# Patient Record
Sex: Male | Born: 1975 | Race: White | Hispanic: No | Marital: Married | Smoking: Former smoker
Health system: Southern US, Community
[De-identification: ages and names within clinical notes are randomized; demographics above are authoritative.]

## PROBLEM LIST (undated history)

## (undated) DIAGNOSIS — L409 Psoriasis, unspecified: Secondary | ICD-10-CM

## (undated) DIAGNOSIS — G4730 Sleep apnea, unspecified: Secondary | ICD-10-CM

## (undated) DIAGNOSIS — Z9989 Dependence on other enabling machines and devices: Secondary | ICD-10-CM

## (undated) DIAGNOSIS — E291 Testicular hypofunction: Secondary | ICD-10-CM

## (undated) DIAGNOSIS — T7840XA Allergy, unspecified, initial encounter: Secondary | ICD-10-CM

## (undated) DIAGNOSIS — R51 Headache: Secondary | ICD-10-CM

## (undated) DIAGNOSIS — G4733 Obstructive sleep apnea (adult) (pediatric): Secondary | ICD-10-CM

## (undated) HISTORY — DX: Allergy, unspecified, initial encounter: T78.40XA

## (undated) HISTORY — DX: Psoriasis, unspecified: L40.9

## (undated) HISTORY — PX: HERNIA REPAIR: SHX51

## (undated) HISTORY — DX: Morbid (severe) obesity due to excess calories: E66.01

## (undated) HISTORY — DX: Obstructive sleep apnea (adult) (pediatric): G47.33

## (undated) HISTORY — DX: Dependence on other enabling machines and devices: Z99.89

## (undated) HISTORY — DX: Testicular hypofunction: E29.1

---

## 2006-04-16 ENCOUNTER — Ambulatory Visit: Payer: Self-pay | Admitting: Family Medicine

## 2006-07-31 ENCOUNTER — Ambulatory Visit: Payer: Self-pay | Admitting: Family Medicine

## 2006-08-06 ENCOUNTER — Ambulatory Visit: Payer: Self-pay | Admitting: Family Medicine

## 2006-11-26 ENCOUNTER — Ambulatory Visit: Payer: Self-pay | Admitting: Family Medicine

## 2006-11-27 ENCOUNTER — Ambulatory Visit: Payer: Self-pay | Admitting: Family Medicine

## 2006-12-01 ENCOUNTER — Emergency Department (HOSPITAL_COMMUNITY): Admission: EM | Admit: 2006-12-01 | Discharge: 2006-12-01 | Payer: Self-pay | Admitting: Emergency Medicine

## 2007-03-21 ENCOUNTER — Ambulatory Visit: Payer: Self-pay | Admitting: Family Medicine

## 2007-11-14 ENCOUNTER — Ambulatory Visit: Payer: Self-pay | Admitting: Family Medicine

## 2007-11-20 ENCOUNTER — Ambulatory Visit: Payer: Self-pay | Admitting: Family Medicine

## 2007-11-20 ENCOUNTER — Encounter: Admission: RE | Admit: 2007-11-20 | Discharge: 2007-11-20 | Payer: Self-pay | Admitting: Family Medicine

## 2008-01-19 ENCOUNTER — Ambulatory Visit: Payer: Self-pay | Admitting: Family Medicine

## 2008-02-20 ENCOUNTER — Encounter: Admission: RE | Admit: 2008-02-20 | Discharge: 2008-02-20 | Payer: Self-pay | Admitting: Endocrinology

## 2009-01-17 ENCOUNTER — Ambulatory Visit: Payer: Self-pay | Admitting: Family Medicine

## 2009-04-05 ENCOUNTER — Emergency Department (HOSPITAL_COMMUNITY): Admission: EM | Admit: 2009-04-05 | Discharge: 2009-04-05 | Payer: Self-pay | Admitting: Emergency Medicine

## 2009-07-10 ENCOUNTER — Emergency Department (HOSPITAL_COMMUNITY): Admission: EM | Admit: 2009-07-10 | Discharge: 2009-07-10 | Payer: Self-pay | Admitting: Family Medicine

## 2010-04-03 ENCOUNTER — Encounter: Payer: Self-pay | Admitting: Family Medicine

## 2010-05-30 ENCOUNTER — Encounter: Payer: Self-pay | Admitting: Family Medicine

## 2010-06-20 ENCOUNTER — Encounter: Payer: Self-pay | Admitting: Family Medicine

## 2010-06-22 ENCOUNTER — Encounter: Payer: Self-pay | Admitting: Family Medicine

## 2010-06-22 ENCOUNTER — Encounter (INDEPENDENT_AMBULATORY_CARE_PROVIDER_SITE_OTHER): Payer: BLUE CROSS/BLUE SHIELD | Admitting: Family Medicine

## 2010-06-22 DIAGNOSIS — E669 Obesity, unspecified: Secondary | ICD-10-CM

## 2010-06-22 DIAGNOSIS — Z0000 Encounter for general adult medical examination without abnormal findings: Secondary | ICD-10-CM

## 2010-06-22 DIAGNOSIS — E291 Testicular hypofunction: Secondary | ICD-10-CM

## 2010-06-22 DIAGNOSIS — L408 Other psoriasis: Secondary | ICD-10-CM

## 2010-06-23 ENCOUNTER — Encounter: Payer: Self-pay | Admitting: Family Medicine

## 2010-06-26 ENCOUNTER — Ambulatory Visit (INDEPENDENT_AMBULATORY_CARE_PROVIDER_SITE_OTHER): Payer: BLUE CROSS/BLUE SHIELD | Admitting: Family Medicine

## 2010-06-26 DIAGNOSIS — E291 Testicular hypofunction: Secondary | ICD-10-CM

## 2010-09-14 ENCOUNTER — Encounter: Payer: Self-pay | Admitting: Family Medicine

## 2010-09-15 ENCOUNTER — Ambulatory Visit (INDEPENDENT_AMBULATORY_CARE_PROVIDER_SITE_OTHER): Payer: BLUE CROSS/BLUE SHIELD | Admitting: Family Medicine

## 2010-09-15 ENCOUNTER — Encounter: Payer: Self-pay | Admitting: Family Medicine

## 2010-09-15 DIAGNOSIS — L409 Psoriasis, unspecified: Secondary | ICD-10-CM | POA: Insufficient documentation

## 2010-09-15 DIAGNOSIS — E291 Testicular hypofunction: Secondary | ICD-10-CM | POA: Insufficient documentation

## 2010-09-15 NOTE — Patient Instructions (Addendum)
Check with a psychologist before taking further steps

## 2010-09-15 NOTE — Progress Notes (Signed)
  Subjective:    Patient ID: Jeff Boyle, male    DOB: January 23, 1976, 35 y.o.   MRN: [id number]  HPI He is here for consult concerning weight loss. He is now considering lap band surgery. He states that in the past he has been on various weight loss programs including Weight Watchers and most recently is involved in a program soon medically supervised. He states that he usually can last 6-7 weeks before he gets tired of it and stops this. He has had difficulty with this his entire life. He did go to a seminar on lab band surgery and would like to pursue this further.   Review of Systems     Objective:   Physical Exam Alert and in no distress. Weight is recorded.       Assessment & Plan:  Morbid obesity. I his weight in regard to diet, exercise, psychological factors. I also discussed the TV program biggest loser with him. We also discussed surgical intervention. Encouraged him to get further psychological counseling before making a final decision. Over 30 minutes spent discussing all these issues with him. I will fill out the appropriate paperwork.

## 2010-09-19 ENCOUNTER — Telehealth: Payer: Self-pay

## 2010-09-19 NOTE — Telephone Encounter (Signed)
Called pt to get info on weight loss left message for him to please let me know who he used and how much weight loss

## 2010-11-17 ENCOUNTER — Encounter: Payer: Self-pay | Admitting: Family Medicine

## 2011-01-17 ENCOUNTER — Telehealth: Payer: Self-pay | Admitting: Family Medicine

## 2011-01-18 ENCOUNTER — Telehealth: Payer: Self-pay | Admitting: Family Medicine

## 2011-01-18 NOTE — Telephone Encounter (Signed)
LETTER MAILED TO PT-LM

## 2011-01-18 NOTE — Telephone Encounter (Signed)
DONE

## 2011-01-25 ENCOUNTER — Telehealth: Payer: Self-pay | Admitting: Family Medicine

## 2011-01-26 ENCOUNTER — Encounter: Payer: Self-pay | Admitting: Family Medicine

## 2011-01-26 ENCOUNTER — Ambulatory Visit (INDEPENDENT_AMBULATORY_CARE_PROVIDER_SITE_OTHER): Payer: BLUE CROSS/BLUE SHIELD | Admitting: Family Medicine

## 2011-01-26 DIAGNOSIS — E291 Testicular hypofunction: Secondary | ICD-10-CM

## 2011-01-26 NOTE — Telephone Encounter (Signed)
APPT MADE

## 2011-01-26 NOTE — Progress Notes (Signed)
  Subjective:    Patient ID: Jeff Boyle, male    DOB: 1975/09/13, 35 y.o.   MRN: [id number]  HPI He is here for consult concerning weight loss. He is in the process to try and get set up for surgery. He is not sure which kind. He hasn't seen a psychiatrist. My record was reviewed with him. He has maintained a BMI in the 50 range since becoming my patient. He apparently has tried various weight loss regimens and all have failed and he admits is usually due to him not sustaining it for very long. He also is a history of hypogonadism and is being followed by Dr. Patsi Sears for this to   Review of Systems     Objective:   Physical Exam Alert and in no distress otherwise not examined       Assessment & Plan:   1. Morbid obesity   2. Hypogonadism male   paperwork was filled out. He will return here as needed

## 2011-03-16 ENCOUNTER — Ambulatory Visit (INDEPENDENT_AMBULATORY_CARE_PROVIDER_SITE_OTHER): Payer: BLUE CROSS/BLUE SHIELD | Admitting: General Surgery

## 2011-03-16 ENCOUNTER — Encounter (INDEPENDENT_AMBULATORY_CARE_PROVIDER_SITE_OTHER): Payer: Self-pay | Admitting: General Surgery

## 2011-03-16 NOTE — Patient Instructions (Signed)
We will start your weight loss surgery evaluation

## 2011-03-19 ENCOUNTER — Ambulatory Visit (INDEPENDENT_AMBULATORY_CARE_PROVIDER_SITE_OTHER): Payer: BLUE CROSS/BLUE SHIELD | Admitting: Medical

## 2011-03-19 ENCOUNTER — Encounter: Payer: Self-pay | Admitting: Medical

## 2011-03-19 ENCOUNTER — Encounter (INDEPENDENT_AMBULATORY_CARE_PROVIDER_SITE_OTHER): Payer: Self-pay | Admitting: General Surgery

## 2011-03-19 VITALS — BP 138/78 | HR 88 | Temp 98.2°F | Resp 18

## 2011-03-19 DIAGNOSIS — J329 Chronic sinusitis, unspecified: Secondary | ICD-10-CM | POA: Insufficient documentation

## 2011-03-19 MED ORDER — AMOXICILLIN 500 MG PO TABS: ORAL_TABLET | ORAL | Status: DC

## 2011-03-19 NOTE — Progress Notes (Signed)
Jeff Boyle is a 36 y.o. male who presents for 3 day hx/o sinus pressure, bad headaches, purulent nasal discharge, swollen lymph nodes, using Mucinex for symptoms.  Denies fever, chills, cough, no chest pain, SOB, ear pain.   Denies sick contacts.  No other aggravating or relieving factors.  No other c/o.  Past Medical History  Diagnosis Date  . Hypogonadism male   . Psoriasis   . OSA on CPAP   . Morbid obesity    ROS: Gen: no fever, chills Skin: no rash Cardio: no CP, palpitations Lungs: no SOB GI: no n/v/d   Objective:   Filed Vitals:   03/19/11 1347  BP: 138/78  Pulse: 88  Temp: 98.2 F (36.8 C)  Resp: 18    General appearance: Alert, WD/WN, no distress                             Skin: warm, no rash                           Head: + moderate frontal sinus tenderness,                            Eyes: conjunctiva normal, corneas clear, PERRLA                            Ears: pearly TMs, external ear canals normal                          Nose: septum midline, turbinates swollen, with erythema and clear discharge             Mouth/throat: MMM, tongue normal, mild pharyngeal erythema,  Post nasal drainage                           Neck: supple, no adenopathy, no thyromegaly, nontender                          Heart: RRR, normal S1, S2, no murmurs                         Lungs: CTA bilaterally, no wheezes, rales, or rhonchi      Assessment and Plan:   Encounter Diagnosis  Name Primary?  . Sinusitis Yes   Advised that symptoms and exam suggest URI, thus, begin nasal saline, c/t Mucinex, hydrate well, but if not improving in 2-3 days, can begin prescription given for Amoxicillin.  Tylenol or Ibuprofen OTC for fever and malaise.  Discussed symptomatic relief, nasal saline, and call or return if worse or not improving in 2-3 days.

## 2011-03-19 NOTE — Progress Notes (Signed)
Patient ID: Jeff Boyle, male   DOB: 01-23-1976, 36 y.o.   MRN: [id number]  Chief Complaint  Patient presents with  . Other    new bariatric- lap band initial    HPI Jeff Boyle is a 36 y.o. male.   HPI 36 year old morbidly obese Caucasian male with a BMI of 59.28 referred by his primary care physician for evaluation for weight loss surgery. The patient states that he struggled all his life with his weight. Despite several attempts for sustained weight loss, he has been unsuccessful. He has tried Weight Watchers, low calorie diets, and a dietitian plan all without long-term success.  He is unsure as to which procedure he is interested in. He would like to discuss more regarding gastric band as well as gastric bypass surgery.  His comorbidities include obstructive sleep apnea on CPAP, hypogonadism, and psoriasis. Past Medical History  Diagnosis Date  . Hypogonadism male   . Psoriasis   . OSA on CPAP   . Morbid obesity     Past Surgical History  Procedure Date  . Hernia repair     as a child- RIH    Family History  Problem Relation Age of Onset  . Diabetes Mother   . Cancer Maternal Grandmother     LUNG  . Heart disease Maternal Grandfather     Social History History  Substance Use Topics  . Smoking status: Former Games developer  . Smokeless tobacco: Never Used  . Alcohol Use: Yes    No Known Allergies  Current Outpatient Prescriptions  Medication Sig Dispense Refill  . Fexofenadine HCl (ALLEGRA PO) Take by mouth as needed.        Marland Kitchen amoxicillin (AMOXIL) 500 MG tablet 2 tablets po BID  40 tablet  0    Review of Systems Review of Systems  Constitutional: Negative for fever, appetite change and unexpected weight change.  HENT: Negative for hearing loss, neck pain and neck stiffness.   Eyes: Negative for photophobia and visual disturbance.  Respiratory: Negative for chest tightness and shortness of breath.        +OSA on CPAP  Cardiovascular: Negative for  chest pain and palpitations.       +DOE, denies orthopnea, SOB at rest, PND  Gastrointestinal: Negative for nausea, diarrhea, constipation and rectal pain.       Very infrequent reflux  Genitourinary: Negative for dysuria, hematuria and difficulty urinating.  Musculoskeletal: Negative for back pain and joint swelling.       B/l knee and ankle pain "discomfort"  Skin:       Has psoriasis  Neurological: Negative for seizures, syncope, speech difficulty, light-headedness and headaches.  Hematological: Negative.   Psychiatric/Behavioral: Negative.     Blood pressure 132/88, pulse 84, temperature 97.7 F (36.5 C), temperature source Temporal, resp. rate 18, height 6\' 1"  (1.854 m), weight 447 lb 6.4 oz (202.939 kg).  Physical Exam Physical Exam  Vitals reviewed. Constitutional: He is oriented to person, place, and time. He appears well-developed and well-nourished. No distress.       Morbidly obese  HENT:  Head: Normocephalic and atraumatic.  Nose: Nose normal.  Mouth/Throat: No oropharyngeal exudate.  Eyes: Conjunctivae and EOM are normal. No scleral icterus.  Neck: Neck supple. No JVD present. No tracheal deviation present.       Short, thick neck  Cardiovascular: Normal rate, regular rhythm and normal heart sounds.   Pulmonary/Chest: Effort normal and breath sounds normal. No respiratory distress. He has no wheezes.  Abdominal: Soft. Bowel sounds are normal. He exhibits no distension. There is no tenderness.       Truncal obesity  Musculoskeletal: Normal range of motion. He exhibits no tenderness.  Lymphadenopathy:    He has no cervical adenopathy.  Neurological: He is alert and oriented to person, place, and time. He exhibits normal muscle tone.  Skin: Skin is warm and dry. He is not diaphoretic.       Some psoriasis on lower legs; thickened skin on b/l LE  Psychiatric: He has a normal mood and affect. His behavior is normal. Judgment and thought content normal.    Data  Reviewed Self reported diet history form Dr Jeff Boyle note (psychiatrist)  Assessment    Morbid Obesity BMI 59 OSA on CPAP Joint Pain Hypogonadism    Plan    Since he is unsure as to which procedure (band vs bypass) we discussed both surgeries that I perform.   We discussed laparoscopic adjustable gastric banding. The patient was given Agricultural engineer. We discussed the risk and benefits of surgery including but not limited to bleeding, infection, injury to surrounding structures, blood clot formation such as deep venous thrombosis or pulmonary embolism, need to convert to an open procedure, band slippage, band erosion, failure to loose weight, port complications (leak or flippage), potential need for reoperative surgery, esophageal dilatation, worsening reflux, and vitamin deficiencies. We discussed the typical post operative recovery course. We discussed that their postoperative diet will be modified for several weeks. We specifically talked about the need to be on a liquid diet for one to 2 weeks after surgery. We also discussed the typical postoperative course with a laparoscopic adjustable gastric band and the need for frequent postoperative visits to assess the volume status of the band.  We discussed the typical expected weight loss with a laparoscopic adjustable gastric band. I explained to the patient that they can expect to lose 40-60% of their excess body weight if they are compliant with their postoperative instructions. However I did explain that some patients loose less than 40% and some patients lose more than 60% of their excess body weight.  I explained that the likelihood of improvement in their obesity is good.  We discussed laparoscopic Roux-en-Y gastric bypass. The patient was given educational material. I quoted the patient that they can expect to lose 50-70% of their excess weight with the gastric bypass. We did discuss the possibility of weight regain several years after  the procedure.  We discussed the risk and benefits of surgery including but not limited to anesthesia risk, bleeding, infection, blood clot formation, anastomotic leak, anastomotic stricture, ulcer formation, death, respiratory complications, intestinal blockage, internal hernia, gallstone formation, vitamin and nutritional deficiencies, injury to surrounding structures, failure to lose weight and mood changes.  We discussed that before and after surgery that there would be an alteration in their diet. I explained that we have put them on a diet 2 weeks before surgery. I also explained that they would be on a liquid diet for 2 weeks after surgery. We discussed that they would have to avoid certain foods such as sugar after surgery. We discussed the importance of physical activity as well as compliance with our dietary and supplement recommendations.  I spent approximately 30 minutes counseling and discussing weight loss surgery with the patient and his wife.   He has decided to proceed with LAPAROSCOPIC ROUX-EN-Y GASTRIC BYPASS.  We will start our weight loss surgery evaluation & work-up including labs, upper gi, abdominal u/s,  ekg, nutrition consult.  We will then submit his information to his insurance provider.  [name]. Andrey Campanile, MD, FACS General, Bariatric, & Minimally Invasive Surgery [facility], [place]         [facility] M 03/19/2011, 4:07 PM

## 2011-03-19 NOTE — Patient Instructions (Signed)

## 2011-03-26 ENCOUNTER — Ambulatory Visit (HOSPITAL_COMMUNITY)
Admission: RE | Admit: 2011-03-26 | Discharge: 2011-03-26 | Disposition: A | Discharge status: Home / Self Care | Payer: BC Managed Care – PPO | Source: Ambulatory Visit | Attending: General Surgery | Admitting: General Surgery

## 2011-03-26 ENCOUNTER — Other Ambulatory Visit (INDEPENDENT_AMBULATORY_CARE_PROVIDER_SITE_OTHER): Payer: Self-pay | Admitting: General Surgery

## 2011-03-26 ENCOUNTER — Other Ambulatory Visit: Payer: Self-pay

## 2011-03-26 DIAGNOSIS — Z419 Encounter for procedure for purposes other than remedying health state, unspecified: Secondary | ICD-10-CM

## 2011-03-26 DIAGNOSIS — K7689 Other specified diseases of liver: Secondary | ICD-10-CM | POA: Insufficient documentation

## 2011-03-26 DIAGNOSIS — Z01818 Encounter for other preprocedural examination: Secondary | ICD-10-CM | POA: Insufficient documentation

## 2011-03-26 DIAGNOSIS — Z01810 Encounter for preprocedural cardiovascular examination: Secondary | ICD-10-CM | POA: Insufficient documentation

## 2011-03-28 ENCOUNTER — Encounter: Payer: BC Managed Care – PPO | Attending: General Surgery | Admitting: *Deleted

## 2011-03-28 ENCOUNTER — Encounter: Payer: Self-pay | Admitting: *Deleted

## 2011-03-28 DIAGNOSIS — Z01818 Encounter for other preprocedural examination: Secondary | ICD-10-CM | POA: Insufficient documentation

## 2011-03-28 DIAGNOSIS — Z713 Dietary counseling and surveillance: Secondary | ICD-10-CM | POA: Insufficient documentation

## 2011-03-28 NOTE — Progress Notes (Signed)
  Pre-Op Assessment Visit: Pre-Operative Gastric Bypass Surgery  Medical Nutrition Therapy:  Appt start time: 1000 end time:  1100.  Patient was seen on 03/28/2011 for Pre-Operative Gastric Bypass Nutrition Assessment. Assessment and letter of approval faxed to [facility] Bariatric Surgery Program coordinator on 03/28/2011.  Approval letter sent to [facility] center and will be available in the chart under the media tab.  Handouts given during visit include:  Pre-Op Goals Handout  Patient to call for Pre-Op and Post-Op Nutrition Education at [facility] when surgery is scheduled.

## 2011-03-28 NOTE — Patient Instructions (Signed)
   Follow Pre-Op Nutrition Goals to prepare for Gastric Bypass Surgery.   Call the [facility] at [phone] once you have been given your surgery date to enrolled in the Pre-Op Nutrition Class. You will need to attend this nutrition class 3-4 weeks prior to your surgery. 

## 2011-03-29 ENCOUNTER — Encounter (HOSPITAL_COMMUNITY): Payer: Self-pay | Admitting: Pharmacy Technician

## 2011-04-06 ENCOUNTER — Encounter (HOSPITAL_COMMUNITY)
Admission: RE | Disposition: A | Discharge status: Home / Self Care | Payer: Self-pay | Source: Ambulatory Visit | Attending: General Surgery

## 2011-04-06 ENCOUNTER — Ambulatory Visit (HOSPITAL_COMMUNITY)
Admission: RE | Admit: 2011-04-06 | Discharge: 2011-04-06 | Disposition: A | Discharge status: Home / Self Care | Payer: BC Managed Care – PPO | Source: Ambulatory Visit | Attending: General Surgery | Admitting: General Surgery

## 2011-04-06 DIAGNOSIS — Z01818 Encounter for other preprocedural examination: Secondary | ICD-10-CM | POA: Insufficient documentation

## 2011-04-06 HISTORY — PX: BREATH TEK H PYLORI: SHX5422

## 2011-04-06 SURGERY — BREATH TEST, FOR HELICOBACTER PYLORI

## 2011-04-10 ENCOUNTER — Encounter (HOSPITAL_COMMUNITY): Payer: Self-pay | Admitting: General Surgery

## 2011-04-21 LAB — CBC WITH DIFFERENTIAL/PLATELET
Basophils Absolute: 0 10*3/uL (ref 0.0–0.1)
Basophils Relative: 0 % (ref 0–1)
Eosinophils Absolute: 0.2 10*3/uL (ref 0.0–0.7)
Eosinophils Relative: 2 % (ref 0–5)
Lymphocytes Relative: 27 % (ref 12–46)
MCV: 82.9 fL (ref 78.0–100.0)
Platelets: 285 10*3/uL (ref 150–400)
RDW: 14.3 % (ref 11.5–15.5)
WBC: 7.8 10*3/uL (ref 4.0–10.5)

## 2011-04-21 LAB — LIPID PANEL
HDL: 36 mg/dL — ABNORMAL LOW (ref >39)
LDL Cholesterol: 75 mg/dL (ref 0–99)
Total CHOL/HDL Ratio: 4.1 Ratio
VLDL: 36 mg/dL (ref 0–40)

## 2011-04-21 LAB — COMPREHENSIVE METABOLIC PANEL
ALT: 24 U/L (ref 0–53)
AST: 21 U/L (ref 0–37)
Alkaline Phosphatase: 54 U/L (ref 39–117)
Chloride: 102 mEq/L (ref 96–112)
Creat: 0.8 mg/dL (ref 0.50–1.35)
Total Bilirubin: 0.4 mg/dL (ref 0.3–1.2)

## 2011-04-25 ENCOUNTER — Other Ambulatory Visit (INDEPENDENT_AMBULATORY_CARE_PROVIDER_SITE_OTHER): Payer: Self-pay | Admitting: General Surgery

## 2011-05-15 NOTE — Patient Instructions (Signed)
Follow:    Pre-Op Diet per MD 2 weeks prior to surgery  Phase 2- Liquids (clear/full) 2 weeks after surgery  Vitamin/Mineral/Calcium guidelines for purchasing bariatric supplements  Exercise guidelines pre and post-op per MD  Follow-up at [facility] in 2 weeks post-op for diet advancement. Contact Babara [facility] as needed with questions/concerns.  

## 2011-05-15 NOTE — Progress Notes (Signed)
  Bariatric Class:  Appt start time: 0830 end time:  0930.  Pre-Operative Nutrition Class  Patient was seen on 05/17/11 for Pre-Operative Bariatric Surgery Education at the [facility].  Surgery date: 3/18 Surgery type: RNY Gastric Bypass Last weight (03/28/11): 448.2 lbs  Samples given per MNT protocol: Bariatric Advantage Multivitamin Lot # [id number] Exp: 9/13  Bariatric Advantage Calcium Citrate Lot # [id number] Exp: 12/13  Bariatric Advantage B12 Lot # [id number] Exp: 5/13  Celebrate Vitamins Multivitamin Lot # [id number] Exp: 6/14  Celebrate VitaminsCalcium Citrate Lot # [id number] Exp: 10/14  Unjury Protein - Chicken Soup Lot# [id number] Exp: 5/14  The following the learning objective met by the patient during this course:   Identifies Pre-Op Dietary Goals and will begin 2 weeks pre-operatively   Identifies appropriate sources of fluids and proteins   States protein recommendations and appropriate sources pre and post-operatively  Identifies Post-Operative Dietary Goals and will follow for 2 weeks post-operatively  Identifies appropriate multivitamin and calcium sources  Describes the need for physical activity post-operatively and will follow MD recommendations  States when to call healthcare provider regarding medication questions or post-operative complications  Handouts given during class include:  Pre-Op Bariatric Surgery Diet Handout  Protein Shake Handout  Post-Op Bariatric Surgery Nutrition Handout  BELT Program Information Flyer  Support Group Information Flyer  Follow-Up Plan: Patient will follow-up at [facility] 2 weeks post operatively for diet advancement per MD.

## 2011-05-17 ENCOUNTER — Encounter (HOSPITAL_COMMUNITY): Payer: Self-pay | Admitting: Pharmacy Technician

## 2011-05-17 ENCOUNTER — Encounter: Payer: BC Managed Care – PPO | Attending: General Surgery

## 2011-05-17 DIAGNOSIS — Z01818 Encounter for other preprocedural examination: Secondary | ICD-10-CM | POA: Insufficient documentation

## 2011-05-17 DIAGNOSIS — Z713 Dietary counseling and surveillance: Secondary | ICD-10-CM | POA: Insufficient documentation

## 2011-05-22 ENCOUNTER — Encounter (HOSPITAL_COMMUNITY)
Admission: RE | Admit: 2011-05-22 | Discharge: 2011-05-22 | Disposition: A | Discharge status: Home / Self Care | Payer: BC Managed Care – PPO | Source: Ambulatory Visit | Attending: General Surgery | Admitting: General Surgery

## 2011-05-22 ENCOUNTER — Encounter (HOSPITAL_COMMUNITY): Payer: Self-pay

## 2011-05-22 HISTORY — DX: Headache: R51

## 2011-05-22 HISTORY — DX: Sleep apnea, unspecified: G47.30

## 2011-05-22 LAB — DIFFERENTIAL
Basophils Absolute: 0 10*3/uL (ref 0.0–0.1)
Eosinophils Absolute: 0.1 10*3/uL (ref 0.0–0.7)
Eosinophils Relative: 1 % (ref 0–5)

## 2011-05-22 LAB — COMPREHENSIVE METABOLIC PANEL
ALT: 24 U/L (ref 0–53)
Alkaline Phosphatase: 53 U/L (ref 39–117)
CO2: 28 mEq/L (ref 19–32)
GFR calc Af Amer: >90 mL/min (ref >90)
GFR calc non Af Amer: >90 mL/min (ref >90)
Glucose, Bld: 84 mg/dL (ref 70–99)
Potassium: 3.9 mEq/L (ref 3.5–5.1)
Sodium: 136 mEq/L (ref 135–145)
Total Bilirubin: 0.4 mg/dL (ref 0.3–1.2)

## 2011-05-22 LAB — SURGICAL PCR SCREEN: MRSA, PCR: NEGATIVE

## 2011-05-22 LAB — CBC
Hemoglobin: 13.8 g/dL (ref 13.0–17.0)
MCH: 27.7 pg (ref 26.0–34.0)
RBC: 4.98 MIL/uL (ref 4.22–5.81)

## 2011-05-22 NOTE — Patient Instructions (Addendum)
20 TAHJ NJOKU  05/22/2011   Your procedure is scheduled on:  05/28/11  Report to SHORT STAY DEPT  at 9:30 AM.  Call this number if you have problems the morning of surgery: [phone]   Remember:   Do not eat food or drink liquids AFTER MIDNIGHT  May have clear liquids UNTIL 6 HOURS BEFORE SURGERY (6:00 AM)  Clear liquids include soda, tea, black coffee, apple or grape juice, broth.  Take these medicines the morning of surgery with A SIP OF WATER:    Do not wear jewelry, make-up or nail polish.  Do not wear lotions, powders, or perfumes.   Do not shave legs or underarms 48 hrs. before surgery (men may shave face)  Do not bring valuables to the hospital.  Contacts, dentures or bridgework may not be worn into surgery.  Leave suitcase in the car. After surgery it may be brought to your room.  For patients admitted to the hospital, checkout time is 11:00 AM the day of discharge.   Patients discharged the day of surgery will not be allowed to drive home.  Name and phone number of your driver:   Special Instructions:   Please read over the following fact sheets that you were given: MRSA  Information / Incentive Spirometer instructions               SHOWER WITH BETASEPT THE NIGHT BEFORE SURGERY AND THE MORNING OF SURGERY               FOLLOW BOWEL PREP FROM OFFICE              NO ASPIRIN / HERBS / IBUPROFEN-MOTRIN-ALEVE 7 DAYS PREOP              BRING C-PAP MASK TO HOSPITAL

## 2011-05-23 ENCOUNTER — Encounter (INDEPENDENT_AMBULATORY_CARE_PROVIDER_SITE_OTHER): Payer: Self-pay | Admitting: General Surgery

## 2011-05-23 ENCOUNTER — Ambulatory Visit (INDEPENDENT_AMBULATORY_CARE_PROVIDER_SITE_OTHER): Payer: BC Managed Care – PPO | Admitting: General Surgery

## 2011-05-23 MED ORDER — PEG 3350-KCL-NABCB-NACL-NASULF 236 G PO SOLR: 4.0000 L | Freq: Once | ORAL | Status: AC

## 2011-05-23 NOTE — Progress Notes (Signed)
Patient ID: Jeff Boyle, male   DOB: 01/30/1976, 35 y.o.   MRN: [id number]  Chief Complaint  Patient presents with  . Bariatric Pre-op    RNY    HPI Jeff Boyle is a 35 y.o. male.   HPI 35 yo morbidly obese WM who I initially saw on January 7 for consideration for gastric bypass surgery. His surgery has been approved and he is currently scheduled for surgery this Monday. He denies any new medications. He denies any trips to the ER or hospital. He denies any new medical diagnoses. He has been walking some. He states he has started his preop diet.   Past Medical History  Diagnosis Date  . Hypogonadism male   . Psoriasis   . OSA on CPAP   . Morbid obesity   . Headache   . Psoriasis   . Sleep apnea     C PAP  . Allergy     Past Surgical History  Procedure Date  . Hernia repair     as a child- RIH  . Breath tek h pylori 04/06/2011    Procedure: BREATH TEK H PYLORI;  Surgeon: Lenaya Pietsch M Nivedita Mirabella, MD;  Location: [facility];  Service: General;  Laterality: N/A;    Family History  Problem Relation Age of Onset  . Diabetes Mother   . Cancer Maternal Grandmother     LUNG  . Heart disease Maternal Grandfather     Social History History  Substance Use Topics  . Smoking status: Former Smoker    Quit date: 05/22/2006  . Smokeless tobacco: Never Used  . Alcohol Use: Yes     1-2 DRINKS PER WEEK    No Known Allergies  Current Outpatient Prescriptions  Medication Sig Dispense Refill  . polyethylene glycol (GOLYTELY) 236 G solution Take 4,000 mLs by mouth once.  4000 mL  0    Review of Systems Review of Systems  Constitutional: Negative for fever, chills, appetite change and unexpected weight change.  HENT: Negative for congestion, trouble swallowing and sinus pressure.   Eyes: Negative for visual disturbance.  Respiratory: Negative for chest tightness and shortness of breath.        +OSA on Cpap  Cardiovascular: Negative for chest pain and leg swelling.       No  PND, no orthopnea, a little DOE  Gastrointestinal:       See HPI  Genitourinary: Negative for dysuria and hematuria.  Musculoskeletal:       Some b/l knee pain  Skin: Negative for rash.  Neurological: Negative for seizures and speech difficulty.  Hematological: Does not bruise/bleed easily.  Psychiatric/Behavioral: Negative for behavioral problems and confusion.    Blood pressure 138/88, pulse 80, temperature 97 F (36.1 C), temperature source Temporal, resp. rate 18, height 6' 1" (1.854 m), weight 448 lb 3.2 oz (203.302 kg).  Physical Exam Physical Exam  Vitals reviewed. Constitutional: He is oriented to person, place, and time. He appears well-developed and well-nourished. No distress.  HENT:  Head: Normocephalic and atraumatic.  Right Ear: External ear normal.  Left Ear: External ear normal.  Mouth/Throat: No oropharyngeal exudate.  Eyes: Conjunctivae are normal. No scleral icterus.  Neck: Normal range of motion. Neck supple. No tracheal deviation present.  Cardiovascular: Normal rate, regular rhythm and normal heart sounds.   Pulmonary/Chest: Effort normal and breath sounds normal. No stridor. No respiratory distress. He has no wheezes.  Abdominal: Soft. Bowel sounds are normal. He exhibits no distension. There is no tenderness.         Large obese abdomen  Musculoskeletal: Normal range of motion. He exhibits no edema and no tenderness.  Lymphadenopathy:    He has no cervical adenopathy.  Neurological: He is alert and oriented to person, place, and time. He exhibits normal muscle tone.  Skin: Skin is warm and dry. He is not diaphoretic.  Psychiatric: He has a normal mood and affect. His behavior is normal. Judgment and thought content normal.    Data Reviewed My initial office note Nutrition consult Psychiatrist note abd u/s Upper gi - no hernia  Assessment    Morbid obesity BMI 59.13 OSA on CPAP hypogonadism     Plan    For Lap Roux-en-y gastric bypass on  Monday.    We reviewed his preop studies. We re-discussed the procedure along with the risks and benefits. We discussed the typical postoperative course. He will perform a bowel prep the day before surgery. He was reminded to bring his CPAP mask to the hospital.  Amiayah Giebel M. Mende Biswell, MD, FACS General, Bariatric, & Minimally Invasive Surgery [facility]        Copper Kirtley M 05/23/2011, 9:19 AM    

## 2011-05-24 ENCOUNTER — Telehealth (INDEPENDENT_AMBULATORY_CARE_PROVIDER_SITE_OTHER): Payer: Self-pay | Admitting: General Surgery

## 2011-05-24 NOTE — Telephone Encounter (Signed)
Pt was seen in the office and instructed to stop Allegra.  He has developed very bad clear runny nose and other allergy symptoms, including headache.  He is calling to ask if he can take plain Tylenol for the headache.  His surgery is Monday for gastric by-pass.  Per Dr. Andrey Campanile (paged) he may restart the Allegra and OK to take the Tylenol as well.  Pt is aware and will continue to avoid ibuprofen and Excedrin Migraine.

## 2011-05-28 ENCOUNTER — Encounter (HOSPITAL_COMMUNITY)
Admission: RE | Disposition: A | Discharge status: Home / Self Care | Payer: Self-pay | Source: Ambulatory Visit | Attending: General Surgery

## 2011-05-28 ENCOUNTER — Encounter (HOSPITAL_COMMUNITY): Payer: Self-pay | Admitting: *Deleted

## 2011-05-28 ENCOUNTER — Inpatient Hospital Stay (HOSPITAL_COMMUNITY)
Admission: RE | Admit: 2011-05-28 | Discharge: 2011-05-31 | DRG: 288 | Disposition: A | Discharge status: Home / Self Care | Payer: BC Managed Care – PPO | Source: Ambulatory Visit | Attending: General Surgery | Admitting: General Surgery

## 2011-05-28 ENCOUNTER — Encounter (HOSPITAL_COMMUNITY): Payer: Self-pay | Admitting: Anesthesiology

## 2011-05-28 ENCOUNTER — Ambulatory Visit (HOSPITAL_COMMUNITY): Payer: BC Managed Care – PPO | Admitting: Anesthesiology

## 2011-05-28 DIAGNOSIS — G4733 Obstructive sleep apnea (adult) (pediatric): Secondary | ICD-10-CM | POA: Diagnosis present

## 2011-05-28 DIAGNOSIS — Z9989 Dependence on other enabling machines and devices: Secondary | ICD-10-CM | POA: Diagnosis present

## 2011-05-28 DIAGNOSIS — E8881 Metabolic syndrome: Secondary | ICD-10-CM | POA: Diagnosis present

## 2011-05-28 DIAGNOSIS — L408 Other psoriasis: Secondary | ICD-10-CM | POA: Diagnosis present

## 2011-05-28 DIAGNOSIS — Z01812 Encounter for preprocedural laboratory examination: Secondary | ICD-10-CM

## 2011-05-28 DIAGNOSIS — Z6841 Body Mass Index (BMI) 40.0 and over, adult: Secondary | ICD-10-CM

## 2011-05-28 DIAGNOSIS — Z87891 Personal history of nicotine dependence: Secondary | ICD-10-CM

## 2011-05-28 HISTORY — PX: GASTRIC ROUX-EN-Y: SHX5262

## 2011-05-28 SURGERY — LAPAROSCOPIC ROUX-EN-Y GASTRIC
Anesthesia: General | Site: Abdomen | Wound class: Clean Contaminated

## 2011-05-28 MED ORDER — HEPARIN SODIUM (PORCINE) 5000 UNIT/ML IJ SOLN
INTRAMUSCULAR | Status: AC
  Filled 2011-05-28: qty 1

## 2011-05-28 MED ORDER — HYDROMORPHONE HCL PF 1 MG/ML IJ SOLN
0.2500 mg | INTRAMUSCULAR | Status: DC | PRN
  Administered 2011-05-28: 0.25 mg via INTRAVENOUS

## 2011-05-28 MED ORDER — DEXAMETHASONE SODIUM PHOSPHATE 10 MG/ML IJ SOLN
INTRAMUSCULAR | Status: DC | PRN
  Administered 2011-05-28: 5 mg via INTRAVENOUS

## 2011-05-28 MED ORDER — MORPHINE SULFATE 2 MG/ML IJ SOLN
2.0000 mg | INTRAMUSCULAR | Status: DC | PRN
  Administered 2011-05-28: 4 mg via INTRAVENOUS
  Administered 2011-05-28: 6 mg via INTRAVENOUS
  Administered 2011-05-29 (×6): 4 mg via INTRAVENOUS
  Administered 2011-05-29: 2 mg via INTRAVENOUS
  Administered 2011-05-29 – 2011-05-30 (×7): 4 mg via INTRAVENOUS
  Filled 2011-05-28 (×2): qty 2
  Filled 2011-05-28: qty 3
  Filled 2011-05-28: qty 2
  Filled 2011-05-28: qty 3
  Filled 2011-05-28 (×2): qty 2
  Filled 2011-05-28: qty 3
  Filled 2011-05-28: qty 2
  Filled 2011-05-28: qty 1
  Filled 2011-05-28 (×5): qty 2

## 2011-05-28 MED ORDER — OXYCODONE-ACETAMINOPHEN 5-325 MG/5ML PO SOLN
5.0000 mL | ORAL | Status: DC | PRN
  Administered 2011-05-30 – 2011-05-31 (×7): 10 mL via ORAL
  Filled 2011-05-28 (×8): qty 10

## 2011-05-28 MED ORDER — LIDOCAINE HCL (CARDIAC) 20 MG/ML IV SOLN
INTRAVENOUS | Status: DC | PRN
  Administered 2011-05-28: 50 mg via INTRAVENOUS

## 2011-05-28 MED ORDER — UNJURY VANILLA POWDER: 2.0000 [oz_av] | Freq: Four times a day (QID) | ORAL | Status: DC

## 2011-05-28 MED ORDER — KCL IN DEXTROSE-NACL 20-5-0.45 MEQ/L-%-% IV SOLN
INTRAVENOUS | Status: AC
  Filled 2011-05-28: qty 1000

## 2011-05-28 MED ORDER — HEPARIN SODIUM (PORCINE) 5000 UNIT/ML IJ SOLN
5000.0000 [IU] | INTRAMUSCULAR | Status: AC
  Administered 2011-05-28: 5000 [IU] via SUBCUTANEOUS

## 2011-05-28 MED ORDER — CISATRACURIUM BESYLATE 2 MG/ML IV SOLN
INTRAVENOUS | Status: DC | PRN
  Administered 2011-05-28 (×2): 4 mg via INTRAVENOUS
  Administered 2011-05-28: 6 mg via INTRAVENOUS

## 2011-05-28 MED ORDER — FIBRIN SEALANT COMPONENT 5 ML EX KIT
PACK | CUTANEOUS | Status: AC
  Filled 2011-05-28: qty 2

## 2011-05-28 MED ORDER — GLYCOPYRROLATE 0.2 MG/ML IJ SOLN
INTRAMUSCULAR | Status: DC | PRN
  Administered 2011-05-28: .8 mg via INTRAVENOUS

## 2011-05-28 MED ORDER — BUPIVACAINE-EPINEPHRINE PF 0.5-1:200000 % IJ SOLN
INTRAMUSCULAR | Status: DC | PRN
  Administered 2011-05-28: 30 mL

## 2011-05-28 MED ORDER — PROMETHAZINE HCL 25 MG/ML IJ SOLN: 6.2500 mg | INTRAMUSCULAR | Status: DC | PRN

## 2011-05-28 MED ORDER — ROCURONIUM BROMIDE 100 MG/10ML IV SOLN
INTRAVENOUS | Status: DC | PRN
  Administered 2011-05-28: 5 mg via INTRAVENOUS

## 2011-05-28 MED ORDER — MEPERIDINE HCL 50 MG/ML IJ SOLN: 6.2500 mg | INTRAMUSCULAR | Status: DC | PRN

## 2011-05-28 MED ORDER — PROPOFOL 10 MG/ML IV BOLUS
INTRAVENOUS | Status: DC | PRN
  Administered 2011-05-28: 250 mg via INTRAVENOUS

## 2011-05-28 MED ORDER — NEOSTIGMINE METHYLSULFATE 1 MG/ML IJ SOLN
INTRAMUSCULAR | Status: DC | PRN
  Administered 2011-05-28: 5 mg via INTRAVENOUS

## 2011-05-28 MED ORDER — MIDAZOLAM HCL 5 MG/5ML IJ SOLN
INTRAMUSCULAR | Status: DC | PRN
  Administered 2011-05-28: 2 mg via INTRAVENOUS

## 2011-05-28 MED ORDER — DEXTROSE 5 % IV SOLN
2.0000 g | INTRAVENOUS | Status: AC
  Administered 2011-05-28: 2 g via INTRAVENOUS
  Filled 2011-05-28: qty 2

## 2011-05-28 MED ORDER — HYDROMORPHONE HCL PF 1 MG/ML IJ SOLN
INTRAMUSCULAR | Status: AC
  Filled 2011-05-28: qty 1

## 2011-05-28 MED ORDER — UNJURY CHICKEN SOUP POWDER: 2.0000 [oz_av] | Freq: Four times a day (QID) | ORAL | Status: DC

## 2011-05-28 MED ORDER — ACETAMINOPHEN 10 MG/ML IV SOLN
INTRAVENOUS | Status: DC | PRN
  Administered 2011-05-28: 1000 mg via INTRAVENOUS

## 2011-05-28 MED ORDER — CEFOXITIN SODIUM-DEXTROSE 1-4 GM-% IV SOLR (PREMIX)
INTRAVENOUS | Status: AC
  Filled 2011-05-28: qty 100

## 2011-05-28 MED ORDER — TISSEEL VH 10 ML EX KIT
PACK | CUTANEOUS | Status: DC | PRN
  Administered 2011-05-28: 1

## 2011-05-28 MED ORDER — 0.9 % SODIUM CHLORIDE (POUR BTL) OPTIME
TOPICAL | Status: DC | PRN
  Administered 2011-05-28: 1000 mL

## 2011-05-28 MED ORDER — HEPARIN SODIUM (PORCINE) 5000 UNIT/ML IJ SOLN
5000.0000 [IU] | Freq: Three times a day (TID) | INTRAMUSCULAR | Status: DC
  Administered 2011-05-28 – 2011-05-31 (×8): 5000 [IU] via SUBCUTANEOUS
  Filled 2011-05-28 (×11): qty 1

## 2011-05-28 MED ORDER — SUFENTANIL CITRATE 50 MCG/ML IV SOLN
INTRAVENOUS | Status: DC | PRN
  Administered 2011-05-28 (×3): 10 ug via INTRAVENOUS
  Administered 2011-05-28: 5 ug via INTRAVENOUS
  Administered 2011-05-28 (×2): 10 ug via INTRAVENOUS
  Administered 2011-05-28: 5 ug via INTRAVENOUS
  Administered 2011-05-28: 10 ug via INTRAVENOUS

## 2011-05-28 MED ORDER — METOCLOPRAMIDE HCL 5 MG/ML IJ SOLN
INTRAMUSCULAR | Status: DC | PRN
  Administered 2011-05-28: 10 mg via INTRAVENOUS

## 2011-05-28 MED ORDER — ACETAMINOPHEN 10 MG/ML IV SOLN
INTRAVENOUS | Status: AC
  Filled 2011-05-28: qty 100

## 2011-05-28 MED ORDER — KCL IN DEXTROSE-NACL 20-5-0.45 MEQ/L-%-% IV SOLN
INTRAVENOUS | Status: DC
  Administered 2011-05-28: 100 mL/h via INTRAVENOUS
  Administered 2011-05-28: 1000 mL via INTRAVENOUS
  Administered 2011-05-29 – 2011-05-30 (×2): via INTRAVENOUS
  Filled 2011-05-28 (×7): qty 1000

## 2011-05-28 MED ORDER — LACTATED RINGERS IV SOLN: INTRAVENOUS | Status: DC

## 2011-05-28 MED ORDER — LACTATED RINGERS IR SOLN
Status: DC | PRN
  Administered 2011-05-28: 3000 mL

## 2011-05-28 MED ORDER — ACETAMINOPHEN 10 MG/ML IV SOLN
1000.0000 mg | Freq: Four times a day (QID) | INTRAVENOUS | Status: AC
  Administered 2011-05-28 – 2011-05-29 (×4): 1000 mg via INTRAVENOUS
  Filled 2011-05-28 (×4): qty 100

## 2011-05-28 MED ORDER — HYDROMORPHONE HCL PF 1 MG/ML IJ SOLN
INTRAMUSCULAR | Status: DC | PRN
  Administered 2011-05-28: 1 mg via INTRAVENOUS

## 2011-05-28 MED ORDER — MORPHINE SULFATE 4 MG/ML IJ SOLN
INTRAMUSCULAR | Status: AC
  Filled 2011-05-28: qty 1

## 2011-05-28 MED ORDER — ONDANSETRON HCL 4 MG/2ML IJ SOLN
INTRAMUSCULAR | Status: DC | PRN
  Administered 2011-05-28: 4 mg via INTRAVENOUS

## 2011-05-28 MED ORDER — SUCCINYLCHOLINE CHLORIDE 20 MG/ML IJ SOLN
INTRAMUSCULAR | Status: DC | PRN
  Administered 2011-05-28: 200 mg via INTRAVENOUS

## 2011-05-28 MED ORDER — ONDANSETRON HCL 4 MG/2ML IJ SOLN: 4.0000 mg | INTRAMUSCULAR | Status: DC | PRN

## 2011-05-28 MED ORDER — BUPIVACAINE LIPOSOME 1.3 % IJ SUSP
20.0000 mL | INTRAMUSCULAR | Status: AC
  Administered 2011-05-28: 20 mL
  Filled 2011-05-28: qty 20

## 2011-05-28 MED ORDER — ACETAMINOPHEN 160 MG/5ML PO SOLN
650.0000 mg | ORAL | Status: DC | PRN
  Administered 2011-05-30: 650 mg via ORAL
  Filled 2011-05-28: qty 20.3

## 2011-05-28 MED ORDER — UNJURY CHOCOLATE CLASSIC POWDER
2.0000 [oz_av] | Freq: Four times a day (QID) | ORAL | Status: DC
  Administered 2011-05-30 (×4): 2 [oz_av] via ORAL

## 2011-05-28 MED ORDER — HYDROMORPHONE HCL PF 1 MG/ML IJ SOLN
0.2500 mg | INTRAMUSCULAR | Status: DC | PRN
  Administered 2011-05-28 (×2): 0.25 mg via INTRAVENOUS
  Administered 2011-05-28 (×4): 0.5 mg via INTRAVENOUS

## 2011-05-28 MED ORDER — LACTATED RINGERS IV SOLN
INTRAVENOUS | Status: DC
  Administered 2011-05-28 (×2): via INTRAVENOUS
  Administered 2011-05-28: 1000 mL via INTRAVENOUS

## 2011-05-28 MED ORDER — BUPIVACAINE-EPINEPHRINE PF 0.25-1:200000 % IJ SOLN
INTRAMUSCULAR | Status: AC
  Filled 2011-05-28: qty 30

## 2011-05-28 SURGICAL SUPPLY — 83 items
APPLICATOR COTTON TIP 6IN STRL (MISCELLANEOUS) ×4 IMPLANT
APPLIER CLIP ROT 13.4 12 LRG (CLIP)
BENZOIN TINCTURE PRP APPL 2/3 (GAUZE/BANDAGES/DRESSINGS) ×2 IMPLANT
BLADE SURG 15 STRL LF DISP TIS (BLADE) IMPLANT
BLADE SURG 15 STRL SS (BLADE)
BLADE SURG SZ11 CARB STEEL (BLADE) ×2 IMPLANT
CABLE HIGH FREQUENCY MONO STRZ (ELECTRODE) ×2 IMPLANT
CANISTER SUCTION 2500CC (MISCELLANEOUS) ×4 IMPLANT
CLIP APPLIE ROT 13.4 12 LRG (CLIP) IMPLANT
CLIP SUT LAPRA TY ABSORB (SUTURE) ×4 IMPLANT
CLOTH BEACON ORANGE TIMEOUT ST (SAFETY) ×2 IMPLANT
CLSR STERI-STRIP ANTIMIC 1/2X4 (GAUZE/BANDAGES/DRESSINGS) ×2 IMPLANT
COVER SURGICAL LIGHT HANDLE (MISCELLANEOUS) ×2 IMPLANT
CUTTER LINEAR ENDO ART 45 ETS (STAPLE) ×2 IMPLANT
DERMABOND ADVANCED (GAUZE/BANDAGES/DRESSINGS)
DERMABOND ADVANCED .7 DNX12 (GAUZE/BANDAGES/DRESSINGS) IMPLANT
DEVICE SUTURE ENDOST 10MM (ENDOMECHANICALS) ×2 IMPLANT
DISSECTOR BLUNT TIP ENDO 5MM (MISCELLANEOUS) ×2 IMPLANT
DRAIN PENROSE 18X1/4 LTX STRL (WOUND CARE) ×2 IMPLANT
DRAPE CAMERA CLOSED 9X96 (DRAPES) ×2 IMPLANT
DRAPE UTILITY XL STRL (DRAPES) ×2 IMPLANT
DRSG TEGADERM 2-3/8X2-3/4 SM (GAUZE/BANDAGES/DRESSINGS) ×14 IMPLANT
ELECT REM PT RETURN 9FT ADLT (ELECTROSURGICAL) ×2
ELECTRODE REM PT RTRN 9FT ADLT (ELECTROSURGICAL) ×1 IMPLANT
GAUZE SPONGE 2X2 8PLY STRL LF (GAUZE/BANDAGES/DRESSINGS) ×3 IMPLANT
GAUZE SPONGE 4X4 16PLY XRAY LF (GAUZE/BANDAGES/DRESSINGS) ×2 IMPLANT
GLOVE BIO SURGEON STRL SZ7.5 (GLOVE) ×4 IMPLANT
GLOVE BIOGEL M STRL SZ7.5 (GLOVE) IMPLANT
GLOVE BIOGEL PI IND STRL 7.0 (GLOVE) ×2 IMPLANT
GLOVE BIOGEL PI INDICATOR 7.0 (GLOVE) ×2
GLOVE INDICATOR 8.0 STRL GRN (GLOVE) ×2 IMPLANT
GOWN STRL NON-REIN LRG LVL3 (GOWN DISPOSABLE) ×4 IMPLANT
GOWN STRL REIN XL XLG (GOWN DISPOSABLE) ×6 IMPLANT
HEMOSTAT SURGICEL 4X8 (HEMOSTASIS) IMPLANT
HOVERMATT SINGLE USE (MISCELLANEOUS) ×2 IMPLANT
KIT BASIN OR (CUSTOM PROCEDURE TRAY) ×2 IMPLANT
KIT GASTRIC LAVAGE 34FR ADT (SET/KITS/TRAYS/PACK) ×2 IMPLANT
NEEDLE SPNL 22GX3.5 QUINCKE BK (NEEDLE) ×2 IMPLANT
NS IRRIG 1000ML POUR BTL (IV SOLUTION) ×2 IMPLANT
PACK CARDIOVASCULAR III (CUSTOM PROCEDURE TRAY) ×2 IMPLANT
PEN SKIN MARKING BROAD (MISCELLANEOUS) ×2 IMPLANT
POUCH SPECIMEN RETRIEVAL 10MM (ENDOMECHANICALS) IMPLANT
RELOAD 45 VASCULAR/THIN (ENDOMECHANICALS) ×2 IMPLANT
RELOAD BLUE (STAPLE) ×8 IMPLANT
RELOAD ENDO STITCH 2.0 (ENDOMECHANICALS)
RELOAD GOLD (STAPLE) ×2 IMPLANT
RELOAD STAPLE TA45 3.5 REG BLU (ENDOMECHANICALS) ×4 IMPLANT
RELOAD WHITE ECR60W (STAPLE) ×2 IMPLANT
SCALPEL HARMONIC ACE (MISCELLANEOUS) ×2 IMPLANT
SCISSORS LAP 5X35 DISP (ENDOMECHANICALS) ×2 IMPLANT
SEALANT SURGICAL APPL DUAL CAN (MISCELLANEOUS) ×2 IMPLANT
SET IRRIG TUBING LAPAROSCOPIC (IRRIGATION / IRRIGATOR) ×2 IMPLANT
SLEEVE ADV FIXATION 12X100MM (TROCAR) ×6 IMPLANT
SLEEVE ADV FIXATION 5X100MM (TROCAR) ×2 IMPLANT
SLEEVE ENDOPATH XCEL 5M (ENDOMECHANICALS) ×2 IMPLANT
SOLUTION ANTI FOG 6CC (MISCELLANEOUS) ×2 IMPLANT
SPONGE GAUZE 2X2 STER 10/PKG (GAUZE/BANDAGES/DRESSINGS) ×3
SPONGE GAUZE 4X4 12PLY (GAUZE/BANDAGES/DRESSINGS) IMPLANT
STAPLE ECHEON FLEX 60 POW ENDO (STAPLE) ×2 IMPLANT
STAPLER VISISTAT 35W (STAPLE) ×2 IMPLANT
STRIP CLOSURE SKIN 1/2X4 (GAUZE/BANDAGES/DRESSINGS) ×2 IMPLANT
SUT ETHILON 3 0 PS 1 (SUTURE) IMPLANT
SUT MNCRL AB 4-0 PS2 18 (SUTURE) ×4 IMPLANT
SUT RELOAD ENDO STITCH 2 48X1 (ENDOMECHANICALS)
SUT RELOAD ENDO STITCH 2.0 (ENDOMECHANICALS)
SUT SILK 2 0 SH (SUTURE) IMPLANT
SUT VIC AB 2-0 SH 27 (SUTURE) ×1
SUT VIC AB 2-0 SH 27X BRD (SUTURE) ×1 IMPLANT
SUTURE RELOAD END STTCH 2 48X1 (ENDOMECHANICALS) IMPLANT
SUTURE RELOAD ENDO STITCH 2.0 (ENDOMECHANICALS) IMPLANT
SYR 20CC LL (SYRINGE) ×2 IMPLANT
SYR 50ML LL SCALE MARK (SYRINGE) ×2 IMPLANT
SYR CONTROL 10ML LL (SYRINGE) ×2 IMPLANT
TOWEL OR 17X26 10 PK STRL BLUE (TOWEL DISPOSABLE) ×2 IMPLANT
TRAY FOLEY CATH 14FRSI W/METER (CATHETERS) ×2 IMPLANT
TROCAR ADV FIXATION 12X100MM (TROCAR) ×2 IMPLANT
TROCAR BALLN 12MMX100 BLUNT (TROCAR) ×2 IMPLANT
TROCAR BLADELESS OPT 5 100 (ENDOMECHANICALS) ×2 IMPLANT
TROCAR ENDOPATH XCEL 12X100 BL (ENDOMECHANICALS) ×6 IMPLANT
TROCAR XCEL 12X100 BLDLESS (ENDOMECHANICALS) ×2 IMPLANT
TUBING ENDO SMARTCAP (MISCELLANEOUS) ×2 IMPLANT
TUBING FILTER THERMOFLATOR (ELECTROSURGICAL) ×2 IMPLANT
WATER STERILE IRR 1500ML POUR (IV SOLUTION) ×2 IMPLANT

## 2011-05-28 NOTE — Op Note (Signed)
Procedure: Upper GI endoscopy  Surgeon: Glenna Fellows  Description of procedure: Upper GI endoscopy is performed intraoperatively at the completion of laparoscopic Roux-en-Y gastric bypass by Dr. Andrey Campanile. The Olympus video endoscope was inserted into the upper esophagus and then passed under direct vision to the EG junction at about 40 cm. The esophagus appeared normal. The small gastric pouch was entered and with the outlet clamp under saline irrigation by Dr. Andrey Campanile the pouch was tensely distended with air with no evidence of leak. The anastomosis was patent and without bleeding. Sutures staple lines were intact without bleeding. The mucosa appeared normal. The pouch was tubular in shape and approximately 4 cm in length. The pouch was then desufflated and the scope withdrawn.  Mariella Saa MD, FACS  05/28/2011, 3:48 PM

## 2011-05-28 NOTE — Anesthesia Postprocedure Evaluation (Signed)
  Anesthesia Post-op Note  Patient: Jeff Boyle  Procedure(s) Performed: Procedure(s) (LRB): LAPAROSCOPIC ROUX-EN-Y GASTRIC (N/A)  Patient Location: PACU  Anesthesia Type: General  Level of Consciousness: awake and alert   Airway and Oxygen Therapy: Patient Spontanous Breathing  Post-op Pain: mild  Post-op Assessment: Post-op Vital signs reviewed, Patient's Cardiovascular Status Stable, Respiratory Function Stable, Patent Airway and No signs of Nausea or vomiting  Post-op Vital Signs: stable  Complications: No apparent anesthesia complications

## 2011-05-28 NOTE — Transfer of Care (Signed)
Immediate Anesthesia Transfer of Care Note  Patient: Jeff Boyle  Procedure(s) Performed: Procedure(s) (LRB): LAPAROSCOPIC ROUX-EN-Y GASTRIC (N/A)  Patient Location: PACU  Anesthesia Type: General  Level of Consciousness: awake and alert   Airway & Oxygen Therapy: Patient Spontanous Breathing and Patient connected to face mask oxygen  Post-op Assessment: Report given to PACU RN and Post -op Vital signs reviewed and stable  Post vital signs: Reviewed and stable  Complications: No apparent anesthesia complications

## 2011-05-28 NOTE — Interval H&P Note (Signed)
History and Physical Interval Note:  05/28/2011 11:16 AM  Jeff Boyle  has presented today for surgery, with the diagnosis of morbid obesity   The various methods of treatment have been discussed with the patient and family. After consideration of risks, benefits and other options for treatment, the patient has consented to  Procedure(s) (LRB): LAPAROSCOPIC ROUX-EN-Y GASTRIC (N/A) as a surgical intervention .  The patients' history has been reviewed, patient examined, no change in status, stable for surgery.  I have reviewed the patients' chart and labs.  Questions were answered to the patient's satisfaction.     pt has done well with his preop diet - lost 14 pounds since I saw him in the office.   [name]. Andrey Campanile, MD, FACS General, Bariatric, & Minimally Invasive Surgery [facility], [place]  [facility] M

## 2011-05-28 NOTE — Op Note (Signed)
Preop diagnosis:  1. Morbid obesity (BMI 59)  2. Obstructive sleep apnea on CPAP 3. Metabolic syndrome  Postop diagnosis:  1. same  Surgical procedure: Laparoscopic Roux-en-Y gastric bypass; EGD  Surgeon: Atilano Ina, M.D. FACS  Asst.: Ovidio Kin, MD FACS Glenna Fellows, MD FACS  Anesthesia: General plus 20cc exparel  Complications: None   Findings: antecolic gastric bypass, 100cm roux limb  EBL: Minimal   Drains: None   Disposition: PACU in good condition   Indications for procedure: 35yo WM with morbid obesity who has been unsuccessful at sustained weight loss. The patient's comorbidities are listed above. We discussed the risk and benefits of surgery including but not limited to anesthesia risk, bleeding, infection, blood clot formation, anastomotic leak, anastomotic stricture, ulcer formation, death, respiratory complications, intestinal blockage, internal hernia, gallstone formation, vitamin and nutritional deficiencies, injury to surrounding structures, failure to lose weight and mood changes.   Description of procedure: Patient is brought to the operating room and general anesthesia induced. The patient had received preoperative broad-spectrum IV antibiotics and subcutaneous heparin. The abdomen was widely sterilely prepped with Chloraprep and draped. Patient timeout was performed and correct patient and procedure confirmed. Access was obtained with a 12 mm Optiview trocar in the left upper quadrant and pneumoperitoneum established without difficulty. Under direct vision 12 mm trocars were placed laterally in the right upper quadrant, right upper quadrant midclavicular line, and to the left and above the umbilicus for the camera port. A 5 mm trocar was placed laterally in the left upper quadrant.  The omentum was brought into the upper abdomen and the transverse mesocolon elevated and the ligament of Treitz clearly identified. A 40 cm biliopancreatic limb was then carefully  measured from the ligament of Treitz. The small intestine was divided at this point with a single firing of the white load linear stapler. A Penrose drain was sutured to the end of the Roux-en-Y limb for later identification. A 100 cm Roux-en-Y limb was then carefully measured. At this point a side-to-side anastomosis was created between the Roux limb and the end of the biliopancreatic limb. This was accomplished with a single firing of the 45 mm white load linear stapler. The common enterotomy was closed with a running 2-0 Vicryl begun at either end of the enterotomy and tied centrally. The mesenteric defect was then closed with running 2-0 silk. The omentum was then divided with the harmonic scalpel up towards the transverse colon to allow mobility of the Roux limb toward the gastric pouch. The patient was then placed in steep reversed Trendelenburg. Through a 5 mm subxiphoid site the [facility] retractor was placed and the left lobe of the liver elevated with excellent exposure of the upper stomach and hiatus. The angle of Hiss was then mobilized with the harmonic scalpel. A 4 cm gastric pouch was then carefully measured along the lesser curve of the stomach. Dissection was carried along the lesser curve at this point with the Harmonic scalpel working carefully back toward the lesser sac at right angles to the lesser curve. The free lesser sac was then entered. After being sure all tubes were removed from the stomach an initial firing of the gold load 60 mm linear stapler was fired at right angles across the lesser curve for about 4 cm. The gastric pouch was further mobilized posteriorly and then the pouch was completed with 3 further firings of the 60 mm blue load linear stapler and 1 final firing of a blue load 45mm linear stapler  up through the previously dissected angle of His. It was ensured that the pouch was completely mobilized away from the gastric remnant. This created a nice tubular 4-5 cm gastric  pouch.  The Roux limb was then brought up in an antecolic fashion with the candycane facing to the patient's left without undue tension. The gastrojejunostomy was created with an initial posterior row of 2-0 Vicryl between the Roux limb and the staple line of the gastric pouch. Enterotomies were then made in the gastric pouch and the Roux limb with the harmonic scalpel and at approximately 2-2-1/2 cm anastomosis was created with a single firing of the 45mm blue load linear stapler. The staple line was inspected and was intact without bleeding. The common enterotomy was then closed with running 2-0 Vicryl begun at either end and tied centrally. The Ewall tube was then easily passed through the anastomosis and an outer anterior layer of running 2-0 Vicryl was placed. The Ewald tube was removed. With the outlet of the gastrojejunostomy clamped and under saline irrigation the assistant performed upper endoscopy and with the gastric pouch tensely distended with air-there was no evidence of leak on this test. The pouch was desufflated. The Vonita Moss defect was closed with running 2-0 silk. The abdomen was inspected for any evidence of bleeding or bowel injury and everything looked fine. The Nathanson retractor was removed under direct vision after coating the anastomosis with Tisseel tissue sealant. All CO2 was evacuated and trochars removed. Skin incisions were closed with 4-0 monocryl in subcuticular fashion and sterile bandages were applied. Sponge needle and instrument counts were correct. The patient was taken to the PACU in good condition.    [name]. Andrey Campanile, MD, FACS General, Bariatric, & Minimally Invasive Surgery [facility], [place]

## 2011-05-28 NOTE — Anesthesia Preprocedure Evaluation (Addendum)
Anesthesia Evaluation  Patient identified by MRN, date of birth, ID band Patient awake    Reviewed: Allergy & Precautions, H&P , NPO status , Patient's Chart, lab work & pertinent test results  Airway Mallampati: II TM Distance: >3 FB Neck ROM: Full    Dental No notable dental hx.    Pulmonary neg pulmonary ROS, sleep apnea ,  breath sounds clear to auscultation  Pulmonary exam normal       Cardiovascular negative cardio ROS  Rhythm:Regular Rate:Normal     Neuro/Psych negative neurological ROS  negative psych ROS   GI/Hepatic negative GI ROS, Neg liver ROS,   Endo/Other  negative endocrine ROSMorbid obesity  Renal/GU negative Renal ROS  negative genitourinary   Musculoskeletal negative musculoskeletal ROS (+)   Abdominal   Peds negative pediatric ROS (+)  Hematology negative hematology ROS (+)   Anesthesia Other Findings   Reproductive/Obstetrics negative OB ROS                           Anesthesia Physical Anesthesia Plan  ASA: III  Anesthesia Plan: General   Post-op Pain Management:    Induction: Intravenous  Airway Management Planned:   Additional Equipment:   Intra-op Plan:   Post-operative Plan: Extubation in OR  Informed Consent: I have reviewed the patients History and Physical, chart, labs and discussed the procedure including the risks, benefits and alternatives for the proposed anesthesia with the patient or authorized representative who has indicated his/her understanding and acceptance.   Dental advisory given  Plan Discussed with: CRNA  Anesthesia Plan Comments:        Anesthesia Quick Evaluation

## 2011-05-28 NOTE — Progress Notes (Signed)
Bowel prep done 05/27/11 as instructed with good results

## 2011-05-28 NOTE — H&P (View-Only) (Signed)
Patient ID: Jeff Boyle, male   DOB: March 08, 1976, 36 y.o.   MRN: [id number]  Chief Complaint  Patient presents with  . Bariatric Pre-op    RNY    HPI Jeff Boyle is a 36 y.o. male.   HPI 36 yo morbidly obese WM who I initially saw on January 7 for consideration for gastric bypass surgery. His surgery has been approved and he is currently scheduled for surgery this Monday. He denies any new medications. He denies any trips to the ER or hospital. He denies any new medical diagnoses. He has been walking some. He states he has started his preop diet.   Past Medical History  Diagnosis Date  . Hypogonadism male   . Psoriasis   . OSA on CPAP   . Morbid obesity   . Headache   . Psoriasis   . Sleep apnea     C PAP  . Allergy     Past Surgical History  Procedure Date  . Hernia repair     as a child- RIH  . Breath tek h pylori 04/06/2011    Procedure: BREATH TEK H PYLORI;  Surgeon: Atilano Ina, MD;  Location: [facility];  Service: General;  Laterality: N/A;    Family History  Problem Relation Age of Onset  . Diabetes Mother   . Cancer Maternal Grandmother     LUNG  . Heart disease Maternal Grandfather     Social History History  Substance Use Topics  . Smoking status: Former Smoker    Quit date: 05/22/2006  . Smokeless tobacco: Never Used  . Alcohol Use: Yes     1-2 DRINKS PER WEEK    No Known Allergies  Current Outpatient Prescriptions  Medication Sig Dispense Refill  . polyethylene glycol (GOLYTELY) 236 G solution Take 4,000 mLs by mouth once.  4000 mL  0    Review of Systems Review of Systems  Constitutional: Negative for fever, chills, appetite change and unexpected weight change.  HENT: Negative for congestion, trouble swallowing and sinus pressure.   Eyes: Negative for visual disturbance.  Respiratory: Negative for chest tightness and shortness of breath.        +OSA on Cpap  Cardiovascular: Negative for chest pain and leg swelling.       No  PND, no orthopnea, a little DOE  Gastrointestinal:       See HPI  Genitourinary: Negative for dysuria and hematuria.  Musculoskeletal:       Some b/l knee pain  Skin: Negative for rash.  Neurological: Negative for seizures and speech difficulty.  Hematological: Does not bruise/bleed easily.  Psychiatric/Behavioral: Negative for behavioral problems and confusion.    Blood pressure 138/88, pulse 80, temperature 97 F (36.1 C), temperature source Temporal, resp. rate 18, height 6\' 1"  (1.854 m), weight 448 lb 3.2 oz (203.302 kg).  Physical Exam Physical Exam  Vitals reviewed. Constitutional: He is oriented to person, place, and time. He appears well-developed and well-nourished. No distress.  HENT:  Head: Normocephalic and atraumatic.  Right Ear: External ear normal.  Left Ear: External ear normal.  Mouth/Throat: No oropharyngeal exudate.  Eyes: Conjunctivae are normal. No scleral icterus.  Neck: Normal range of motion. Neck supple. No tracheal deviation present.  Cardiovascular: Normal rate, regular rhythm and normal heart sounds.   Pulmonary/Chest: Effort normal and breath sounds normal. No stridor. No respiratory distress. He has no wheezes.  Abdominal: Soft. Bowel sounds are normal. He exhibits no distension. There is no tenderness.  Large obese abdomen  Musculoskeletal: Normal range of motion. He exhibits no edema and no tenderness.  Lymphadenopathy:    He has no cervical adenopathy.  Neurological: He is alert and oriented to person, place, and time. He exhibits normal muscle tone.  Skin: Skin is warm and dry. He is not diaphoretic.  Psychiatric: He has a normal mood and affect. His behavior is normal. Judgment and thought content normal.    Data Reviewed My initial office note Nutrition consult Psychiatrist note abd u/s Upper gi - no hernia  Assessment    Morbid obesity BMI 59.13 OSA on CPAP hypogonadism     Plan    For Lap Roux-en-y gastric bypass on  Monday.    We reviewed his preop studies. We re-discussed the procedure along with the risks and benefits. We discussed the typical postoperative course. He will perform a bowel prep the day before surgery. He was reminded to bring his CPAP mask to the hospital.  [name]. Andrey Campanile, MD, FACS General, Bariatric, & Minimally Invasive Surgery [facility], [place]        [facility] M 05/23/2011, 9:19 AM

## 2011-05-29 ENCOUNTER — Inpatient Hospital Stay (HOSPITAL_COMMUNITY): Payer: BC Managed Care – PPO

## 2011-05-29 DIAGNOSIS — Z01810 Encounter for preprocedural cardiovascular examination: Secondary | ICD-10-CM

## 2011-05-29 LAB — HEMOGLOBIN AND HEMATOCRIT, BLOOD: HCT: 37.4 % — ABNORMAL LOW (ref 39.0–52.0)

## 2011-05-29 LAB — CBC
HCT: 38.4 % — ABNORMAL LOW (ref 39.0–52.0)
Hemoglobin: 13.2 g/dL (ref 13.0–17.0)
WBC: 10.1 10*3/uL (ref 4.0–10.5)

## 2011-05-29 LAB — DIFFERENTIAL
Basophils Absolute: 0 10*3/uL (ref 0.0–0.1)
Basophils Relative: 0 % (ref 0–1)
Lymphocytes Relative: 12 % (ref 12–46)
Monocytes Absolute: 1 10*3/uL (ref 0.1–1.0)
Monocytes Relative: 10 % (ref 3–12)
Neutro Abs: 7.8 10*3/uL — ABNORMAL HIGH (ref 1.7–7.7)
Neutrophils Relative %: 78 % — ABNORMAL HIGH (ref 43–77)

## 2011-05-29 MED ORDER — IOHEXOL 300 MG/ML  SOLN
50.0000 mL | Freq: Once | INTRAMUSCULAR | Status: AC | PRN
  Administered 2011-05-29: 50 mL via ORAL

## 2011-05-29 NOTE — Progress Notes (Signed)
1 Day Post-Op  Subjective: Please note pt seen and examined in radiology.   Pain ok. No n/v. Ambulated twice this am. Urinated spontaneously.  Had some nursing concerns last pm which have been addressed  Objective: Vital signs in last 24 hours: Temp:  [97.9 F (36.6 C)-99.2 F (37.3 C)] 98 F (36.7 C) (03/19 1000) Pulse Rate:  [75-93] 77  (03/19 1000) Resp:  [11-24] 20  (03/19 1000) BP: (121-170)/(65-84) 121/75 mmHg (03/19 1000) SpO2:  [93 %-100 %] 96 % (03/19 1000) FiO2 (%):  [4 %] 4 % (03/18 2025) Last BM Date: 05/27/11  Intake/Output from previous day: 03/18 0701 - 03/19 0700 In: 3300 [I.V.:3300] Out: 1175 [Urine:1150; Blood:25] Intake/Output this shift: Total I/O In: -  Out: 300 [Urine:300]  Alert, nad, smiling cta Reg Obese, incision c/d/i. Expected mild soreness.  +scds  Lab Results:   Basename 05/29/11 0415 05/28/11 1710  WBC 10.1 --  HGB 13.2 14.4  HCT 38.4* 41.5  PLT 257 --   BMET No results found for this basename: NA:2,K:2,CL:2,CO2:2,GLUCOSE:2,BUN:2,CREATININE:2,CALCIUM:2 in the last 72 hours PT/INR No results found for this basename: LABPROT:2,INR:2 in the last 72 hours ABG No results found for this basename: PHART:2,PCO2:2,PO2:2,HCO3:2 in the last 72 hours  Studies/Results: No results found.  Anti-infectives: Anti-infectives     Start     Dose/Rate Route Frequency Ordered Stop   05/28/11 0927   cefOXitin (MEFOXIN) 2 g in dextrose 5 % 50 mL IVPB        2 g 100 mL/hr over 30 Minutes Intravenous 60 min pre-op 05/28/11 4540 05/28/11 1211          Assessment/Plan: s/p Procedure(s) (LRB): LAPAROSCOPIC ROUX-EN-Y GASTRIC (N/A)  Doing well - looks good, no fever, no tachycardia Cont ambulation, pulm toilet Cont VTE prophylaxis. Hgb acceptable. 14.4 to 13.2; repeat tonight Currently getting Gastrograffin swallow - f/u results CPAP at night.   [name]. Andrey Campanile, MD, FACS General, Bariatric, & Minimally Invasive Surgery [facility], [place]   LOS: 1 day    Atilano Ina 05/29/2011

## 2011-05-29 NOTE — Progress Notes (Signed)
Dr. Andrey Campanile notified of UGI results and orders received to advance to POD #1 diet; Trula Ore, RN aware of results and orders. Talmadge Chad, RN Bariatric Nurse Coordinator

## 2011-05-29 NOTE — Progress Notes (Signed)
Pt alert and oriented and ambulating in hallways without difficulty; denies nausea or vomiting today; burping but denies flatus or BM; voiding well; c/o abdominal discomfort and relief with prn pain meds; doppler negative; awaiting UGI; discharge instructions given for pt to review and will complete tomorrow; family at bedside; questions answered. GASTRIC BYPASS/SLEEVE DISCHARGE INSTRUCTIONS  Drs. Fredrik Rigger, Hoxworth, Wilson, and Purdy Call if you have any problems.   Call [phone] and ask for the surgeon on call.    If you need immediate assistance come to the ER at [facility]. Tell the ER personnel that you are a new post-op gastric bypass patient. Signs and symptoms to report:   Severe vomiting or nausea. If you cannot tolerate clear liquids for longer than 1 day, you need to call your surgeon.    Abdominal pain which does not get better after taking your pain medication   Fever greater than 101 F degree   Difficulty breathing   Chest pain    Redness, swelling, drainage, or foul odor at incision sites    If your incisions open or pull apart   Swelling or pain in calf (lower leg)   Diarrhea, frequent watery, uncontrolled bowel movements.   Constipation, (no bowel movements for 3 days) if this occurs, Take Milk of Magnesia, 2 tablespoons by mouth, 3 times a day for 2 days if needed.  Call your doctor if constipation continues. Stop taking Milk of Magnesia once you have had a bowel movement. You may also use Miralax according to the label instructions.   Anything you consider "abnormal for you".   Normal side effects after Surgery:   Unable to sleep at night or concentrate   Irritability   Being tearful (crying) or depressed   These are common complaints, possibly related to your anesthesia, stress of surgery and change in lifestyle, that usually go away a few weeks after surgery.  If these feelings continue, call your medical doctor.  Wound Care You may have surgical glue,  steri-strips, or staples over your incisions after surgery.  Surgical glue:  Looks like a clear film over your incisions and will wear off gradually. Steri-strips: Strips of tape over your incisions. You may notice a yellowish color on the skin underneath the steri-strips. This is a substance used to make the steri-strips stick better. Do not pull the steri-strips off - let them fall off.  Staples: Cherlynn Polo may be removed before you leave the hospital. If you go home with staples, call [facility] [phone]) for an appointment with your surgeon's nurse to have staples removed in 7 - 10 days. Showering: You may shower two days after your surgery unless otherwise instructed by your surgeon. Wash gently around wounds with warm soapy water, rinse well, and gently pat dry.  If you have a drain, you may need someone to hold this while you shower. Avoid tub baths until staples are removed and incisions are healed.    Medications   Medications should be liquid or crushed if larger than the size of a dime.  Extended release pills should not be crushed.   Depending on the size and number of medications you take, you may need to stagger/change the time you take your medications so that you do not over-fill your pouch.    Make sure you follow-up with your primary care physician to make medication adjustments needed during rapid weight loss and life-style adjustment.   If you are diabetic, follow up with the doctor that prescribes  your diabetes medication(s) within one week after surgery and check your blood sugar regularly.   Do not drive while taking narcotics!   Do not take acetaminophen (Tylenol) and Roxicet or Lortab Elixir at the same time since these pain medications contain acetaminophen.  Diet at home: (First 2 Weeks) You will see the nutritionist two weeks after your surgery. She will advance your diet if you are tolerating liquids well. Once at home, if you have severe vomiting or  nausea and cannot tolerate clear liquids lasting longer than 1 day, call your surgeon.  Begin high protein shake 2 ounces every 3 hours, 5 - 6 times per day.  Gradually increase the amount you drink as tolerated.  You may find it easier to slowly sip shakes throughout the day.  It is important to get your proteins in first.   Protein Shake   Drink at least 2 ounces of shake 5-6 times per day   Each serving of protein shakes should have a minimum of 15 grams of protein and no more than 5 grams of carbohydrate    Increase the amount of protein shake you drink as tolerated   Protein powder may be added to fluids such as non-fat milk or Lactaid milk (limit to 20 grams added protein powder per serving   The initial goal is to drink at least 8 ounces of protein shake/drink per day (or as directed by the nutritionist). Some examples of protein shakes are [employer], [employer], EAS Edge HP, and Unjury. Hydration   Gradually increase the amount of water and other liquids as tolerated (See Acceptable Fluids)   Gradually increase the amount of protein shake as tolerated     Sip fluids slowly and throughout the day   May use Sugar substitutes, use sparingly (limit to 6 - 8 packets per day). Your fluid goal is 64 ounces of fluid daily. It may take a few weeks to build up to this.         32 oz (or more) should be clear liquids and 32 oz (or more) should be full liquids.         Liquids should not contain sugar, caffeine, or carbonation! Acceptable Fluids Clear Liquids:   Water or Sugar-free flavored water, Fruit H2O   Decaffeinated coffee or tea (sugar-free)   Crystal Lite, Wyler's Lite, Minute Maid Lite   Sugar-free Jell-O   Bouillon or broth   Sugar-free Popsicle:   *Less than 20 calories each; Limit 1 per day   Full Liquids:              Protein Shakes/Drinks + 2 choices per day of other full liquids shown below.    Other full liquids must be: No more than 12 grams of Carbs per serving,   No more than 3 grams of Fat per serving   Strained low-fat cream soup   Non-Fat milk   Fat-free Lactaid Milk   Sugar-free yogurt (Dannon Lite & Fit) Vitamins and Minerals (Start 1 day after surgery unless otherwise directed)   2 Chewable Multivitamin / Multimineral Supplement (i.e. Centrum for Adults)   Chewable Calcium Citrate with Vitamin D-3. Take 1500 mg each day.           (Example: 3 Chewable Calcium Plus 600 with Vitamin D-3 can be found at [facility])         Vitamin B-12, 350 - 500 micrograms (oral tablet) each day   Do not mix multivitamins containing iron with calcium supplements;  take 2 hours   apart   Do not substitute Tums (calcium carbonate) for your calcium   Menstruating women and those at risk for anemia may need extra iron. Talk with your doctor to see if you need additional iron.    If you need extra iron:  Total daily Iron recommendations (including Vitamins) = 50 - 100 mg Iron/day Do not stop taking or change any vitamins or minerals until you talk to your nutritionist or surgeon. Your nutritionist and / or physician must approve all vitamin and mineral supplements. Exercise For maximum success, begin exercising as soon as your doctor recommends. Make sure your physician approves any physical activity.   Depending on fitness level, begin with a simple walking program   Walk 5-15 minutes each day, 7 days per week.    Slowly increase until you are walking 30-45 minutes per day   Consider joining our BELT program. [phone] or email [email or web address] [email or web address] Things to remember:    You may have sexual relations when you feel comfortable. It is VERY important for male patients to use a reliable birth control method. Fertility often increases after surgery. Do not get pregnant for at least 18 months.   It is very important to keep all follow up appointments with your surgeon, nutritionist, primary care physician, and behavioral health practitioner. After the first year, please follow up  with your bariatric surgeon at least once a year in order to maintain best weight loss results.  [facility]: [phone] [facility]: [phone]   Free counseling is available for you and your family through collaboration between [facility] and [place]. Please call [phone] and leave a message.    Consider purchasing a medical alert bracelet that says you had gastric bypass surgery.    [facility] has a free Bariatric Surgery Support Group that meets monthly, the 3rd Thursday, 6 pm, Classroom #1, [place]. You may register online at [email or web address], but registration is not necessary. Select Classes and Support Groups, Bariatric Surgery, or Call [phone]   Do not return to work or drive until cleared by your surgeon   Use your CPAP when sleeping if applicable   Do not lift anything greater than ten pounds for at least two weeks  Talmadge Chad, RN Bariatric Nurse Coordinator

## 2011-05-29 NOTE — Progress Notes (Signed)
Bilateral lower extremity venous duplex completed at 08:25.  Preliminary report is negative for DVT, SVT, or a Baker's cyst.

## 2011-05-30 LAB — CBC
MCH: 27.5 pg (ref 26.0–34.0)
MCHC: 33.6 g/dL (ref 30.0–36.0)
Platelets: 228 10*3/uL (ref 150–400)
RDW: 13.6 % (ref 11.5–15.5)

## 2011-05-30 LAB — DIFFERENTIAL
Basophils Absolute: 0 10*3/uL (ref 0.0–0.1)
Basophils Relative: 0 % (ref 0–1)
Eosinophils Absolute: 0.1 10*3/uL (ref 0.0–0.7)
Lymphocytes Relative: 30 % (ref 12–46)
Monocytes Absolute: 1.1 10*3/uL — ABNORMAL HIGH (ref 0.1–1.0)
Neutrophils Relative %: 54 % (ref 43–77)

## 2011-05-30 LAB — HEMOGLOBIN AND HEMATOCRIT, BLOOD: Hemoglobin: 13.3 g/dL (ref 13.0–17.0)

## 2011-05-30 MED ORDER — BARRIER CREAM NON-SPECIFIED: 1.0000 "application " | TOPICAL_CREAM | Freq: Two times a day (BID) | TOPICAL | Status: DC | PRN

## 2011-05-30 NOTE — Progress Notes (Signed)
2 Days Post-Op  Subjective: Ambulated multiple times. Denies nausea/reflux. Minimal pain. No flatus. C/o of some slow urination and trouble starting stream. Tolerated water/ice   Objective: Vital signs in last 24 hours: Temp:  [97.9 F (36.6 C)-98.6 F (37 C)] 97.9 F (36.6 C) (03/20 1000) Pulse Rate:  [71-84] 84  (03/20 1000) Resp:  [18-20] 19  (03/20 1000) BP: (111-134)/(59-85) 125/76 mmHg (03/20 1000) SpO2:  [95 %-98 %] 98 % (03/20 1000) Last BM Date: 05/27/11  Intake/Output from previous day: 03/19 0701 - 03/20 0700 In: 2120 [P.O.:60; I.V.:2060] Out: 2300 [Urine:2300] Intake/Output this shift: Total I/O In: -  Out: 400 [Urine:400]  Alert, smiling cta Reg Obese, soft, min TTP. Incision c/d/i. +bs +scd  Lab Results:   Basename 05/30/11 0424 05/29/11 1940 05/29/11 0415  WBC 7.2 -- 10.1  HGB 12.0* 12.9* --  HCT 35.7* 37.4* --  PLT 228 -- 257   BMET No results found for this basename: NA:2,K:2,CL:2,CO2:2,GLUCOSE:2,BUN:2,CREATININE:2,CALCIUM:2 in the last 72 hours PT/INR No results found for this basename: LABPROT:2,INR:2 in the last 72 hours ABG No results found for this basename: PHART:2,PCO2:2,PO2:2,HCO3:2 in the last 72 hours  Studies/Results: Dg Ugi W/water Sol Cm  05/29/2011  *RADIOLOGY REPORT*  Clinical Data:  Post gastric bypass.  UPPER GI SERIES WITH KUB  Technique:  Routine upper GI series was performed with 50 ml water- soluble.  Fluoroscopy Time: 2.00 minutes  Comparison:  03/26/2011.  Findings: The patient ingested 50 ml of water soluble contrast. There is immediate flow through the gastric small bowel anastomosis (which is slightly narrowed).  No leak is identified.  There is mild transient reflux.  This responded to the patient being in the upright position.  On delayed imaging, proximal small bowel does not appear significantly dilated.  Distal anastomoses not adequately assessed although no gross abnormality detected.  IMPRESSION: No leak identified post  gastric bypass.  Mild reflux.  Results discussed with Dr. Andrey Campanile.  Original Report Authenticated By: Fuller Canada, M.D.    Anti-infectives: Anti-infectives     Start     Dose/Rate Route Frequency Ordered Stop   05/28/11 0927   cefOXitin (MEFOXIN) 2 g in dextrose 5 % 50 mL IVPB        2 g 100 mL/hr over 30 Minutes Intravenous 60 min pre-op 05/28/11 0927 05/28/11 1211          Assessment/Plan: s/p Procedure(s) (LRB): LAPAROSCOPIC ROUX-EN-Y GASTRIC (N/A)  Will adv to protein shake today Will decrease IVF to see if helps with urination Cont CPAP at night Anemia- hgb 13.2 yesterday am, now 12. Not tachy. Normotensive. Will keep tonight to monitor bld count. Will cont subcu heparin for now. If hgb cont to fall, will hold heparin Cont pulm toilet  [name]. Andrey Campanile, MD, FACS General, Bariatric, & Minimally Invasive Surgery [facility], [place]   LOS: 2 days    Atilano Ina 05/30/2011

## 2011-05-30 NOTE — Progress Notes (Signed)
Pt ambulating in hallways without difficulty; states he is disappointed he is not going home today; encouragement given; wife and mother at bedside; pt alert and oriented; VSS; tolerating water well; will advance to protein shake today; denies nausea or vomiting; denies flatus or BM at this time; voiding well but does c/o some slow initiation of stream but denies any pain or burning with urination; pt already has follow up appts with [facility] and [facility]; aware of support group and BELT program; discharge instructions reviewed and pt, mother and wife verbalized understanding of. Continue current plan of care with possible discharge tomorrow. GASTRIC BYPASS/SLEEVE DISCHARGE INSTRUCTIONS  Drs. Fredrik Rigger, [place], [place], and [place] Call if you have any problems.   Call [phone] and ask for the surgeon on call.    If you need immediate assistance come to the ER at [facility]. Tell the ER personnel that you are a new post-op gastric bypass patient. Signs and symptoms to report:   Severe vomiting or nausea. If you cannot tolerate clear liquids for longer than 1 day, you need to call your surgeon.    Abdominal pain which does not get better after taking your pain medication   Fever greater than 101 F degree   Difficulty breathing   Chest pain    Redness, swelling, drainage, or foul odor at incision sites    If your incisions open or pull apart   Swelling or pain in calf (lower leg)   Diarrhea, frequent watery, uncontrolled bowel movements.   Constipation, (no bowel movements for 3 days) if this occurs, Take Milk of Magnesia, 2 tablespoons by mouth, 3 times a day for 2 days if needed.  Call your doctor if constipation continues. Stop taking Milk of Magnesia once you have had a bowel movement. You may also use Miralax according to the label instructions.   Anything you consider "abnormal for you".   Normal side effects after Surgery:   Unable to sleep at night or concentrate   Irritability   Being  tearful (crying) or depressed   These are common complaints, possibly related to your anesthesia, stress of surgery and change in lifestyle, that usually go away a few weeks after surgery.  If these feelings continue, call your medical doctor.  Wound Care You may have surgical glue, steri-strips, or staples over your incisions after surgery.  Surgical glue:  Looks like a clear film over your incisions and will wear off gradually. Steri-strips: Strips of tape over your incisions. You may notice a yellowish color on the skin underneath the steri-strips. This is a substance used to make the steri-strips stick better. Do not pull the steri-strips off - let them fall off.  Staples: Cherlynn Polo may be removed before you leave the hospital. If you go home with staples, call [facility] [phone]) for an appointment with your surgeon's nurse to have staples removed in 7 - 10 days. Showering: You may shower two days after your surgery unless otherwise instructed by your surgeon. Wash gently around wounds with warm soapy water, rinse well, and gently pat dry.  If you have a drain, you may need someone to hold this while you shower. Avoid tub baths until staples are removed and incisions are healed.    Medications   Medications should be liquid or crushed if larger than the size of a dime.  Extended release pills should not be crushed.   Depending on the size and number of medications you take, you may need to stagger/change  the time you take your medications so that you do not over-fill your pouch.    Make sure you follow-up with your primary care physician to make medication adjustments needed during rapid weight loss and life-style adjustment.   If you are diabetic, follow up with the doctor that prescribes your diabetes medication(s) within one week after surgery and check your blood sugar regularly.   Do not drive while taking narcotics!   Do not take acetaminophen (Tylenol) and Roxicet or  Lortab Elixir at the same time since these pain medications contain acetaminophen.  Diet at home: (First 2 Weeks) You will see the nutritionist two weeks after your surgery. She will advance your diet if you are tolerating liquids well. Once at home, if you have severe vomiting or nausea and cannot tolerate clear liquids lasting longer than 1 day, call your surgeon.  Begin high protein shake 2 ounces every 3 hours, 5 - 6 times per day.  Gradually increase the amount you drink as tolerated.  You may find it easier to slowly sip shakes throughout the day.  It is important to get your proteins in first.   Protein Shake   Drink at least 2 ounces of shake 5-6 times per day   Each serving of protein shakes should have a minimum of 15 grams of protein and no more than 5 grams of carbohydrate    Increase the amount of protein shake you drink as tolerated   Protein powder may be added to fluids such as non-fat milk or Lactaid milk (limit to 20 grams added protein powder per serving   The initial goal is to drink at least 8 ounces of protein shake/drink per day (or as directed by the nutritionist). Some examples of protein shakes are [employer], [employer], EAS Edge HP, and Unjury. Hydration   Gradually increase the amount of water and other liquids as tolerated (See Acceptable Fluids)   Gradually increase the amount of protein shake as tolerated     Sip fluids slowly and throughout the day   May use Sugar substitutes, use sparingly (limit to 6 - 8 packets per day). Your fluid goal is 64 ounces of fluid daily. It may take a few weeks to build up to this.         32 oz (or more) should be clear liquids and 32 oz (or more) should be full liquids.         Liquids should not contain sugar, caffeine, or carbonation! Acceptable Fluids Clear Liquids:   Water or Sugar-free flavored water, Fruit H2O   Decaffeinated coffee or tea (sugar-free)   Crystal Lite, Wyler's Lite, Minute Maid Lite   Sugar-free  Jell-O   Bouillon or broth   Sugar-free Popsicle:   *Less than 20 calories each; Limit 1 per day   Full Liquids:              Protein Shakes/Drinks + 2 choices per day of other full liquids shown below.    Other full liquids must be: No more than 12 grams of Carbs per serving,  No more than 3 grams of Fat per serving   Strained low-fat cream soup   Non-Fat milk   Fat-free Lactaid Milk   Sugar-free yogurt (Dannon Lite & Fit) Vitamins and Minerals (Start 1 day after surgery unless otherwise directed)   2 Chewable Multivitamin / Multimineral Supplement (i.e. Centrum for Adults)   Chewable Calcium Citrate with Vitamin D-3. Take 1500 mg each day.           (  Example: 3 Chewable Calcium Plus 600 with Vitamin D-3 can be found at [facility])         Vitamin B-12, 350 - 500 micrograms (oral tablet) each day   Do not mix multivitamins containing iron with calcium supplements; take 2 hours   apart   Do not substitute Tums (calcium carbonate) for your calcium   Menstruating women and those at risk for anemia may need extra iron. Talk with your doctor to see if you need additional iron.    If you need extra iron:  Total daily Iron recommendations (including Vitamins) = 50 - 100 mg Iron/day Do not stop taking or change any vitamins or minerals until you talk to your nutritionist or surgeon. Your nutritionist and / or physician must approve all vitamin and mineral supplements. Exercise For maximum success, begin exercising as soon as your doctor recommends. Make sure your physician approves any physical activity.   Depending on fitness level, begin with a simple walking program   Walk 5-15 minutes each day, 7 days per week.    Slowly increase until you are walking 30-45 minutes per day   Consider joining our BELT program. [phone] or email belt@[employer] .edu Things to remember:    You may have sexual relations when you feel comfortable. It is VERY important for male patients to use a reliable birth  control method. Fertility often increases after surgery. Do not get pregnant for at least 18 months.   It is very important to keep all follow up appointments with your surgeon, nutritionist, primary care physician, and behavioral health practitioner. After the first year, please follow up with your bariatric surgeon at least once a year in order to maintain best weight loss results.  [facility]: [phone] [facility]: [phone]   Free counseling is available for you and your family through collaboration between [facility] and [place]. Please call [phone] and leave a message.    Consider purchasing a medical alert bracelet that says you had gastric bypass surgery.    The [facility] has a free Bariatric Surgery Support Group that meets monthly, the 3rd Thursday, 6 pm, Classroom #1, [place]. You may register online at [email or web address], but registration is not necessary. Select Classes and Support Groups, Bariatric Surgery, or Call [phone]   Do not return to work or drive until cleared by your surgeon   Use your CPAP when sleeping if applicable   Do not lift anything greater than ten pounds for at least two weeks  Talmadge Chad, RN Bariatric Nurse  Coordinator

## 2011-05-30 NOTE — Progress Notes (Signed)
Dr. Andrey Campanile paged to ask if patient can use neti pot from home for nasal congestion.

## 2011-05-31 LAB — HEMOGLOBIN AND HEMATOCRIT, BLOOD: HCT: 35.8 % — ABNORMAL LOW (ref 39.0–52.0)

## 2011-05-31 MED ORDER — UNJURY VANILLA POWDER: 2.0000 [oz_av] | Freq: Four times a day (QID) | ORAL | Status: DC

## 2011-05-31 MED ORDER — OXYCODONE-ACETAMINOPHEN 5-325 MG/5ML PO SOLN: 5.0000 mL | ORAL | Status: AC | PRN

## 2011-05-31 NOTE — Discharge Instructions (Signed)
Care After Please read the instructions outlined below. Refer to this sheet in the next few weeks. These discharge instructions provide you with general information on care after leaving the hospital. Your surgeon may also give you specific instructions. While treatment has been planned according to the most current medical practices available, unavoidable complications sometimes happen. If you have any problems or questions after discharge, please call your surgeon.  AFTER THE PROCEDURE After surgery, you will be taken to the recovery area. A nurse will watch and check your progress there. Once you're awake, stable, and taking fluids well, barring other problems you may be allowed to go home.  Do not drink alcohol, drive a car, use public transportation, or sign important papers for at least 1 day following surgery.  HOME CARE INSTRUCTIONS It is normal to be sore for a couple weeks after surgery. See your surgeon if the soreness seems to be getting worse rather than better.  Do not resume physical activities or driving until directed by your surgeon.  Avoid lifting anything over 15 pounds (4.5 kg) for 6 weeks following surgery, or until approved by your surgeon.  Only take over-the-counter or prescription medications for pain, discomfort, or fever as directed by your surgeon.  CRUSH your Medications Resume your POSTOP BARIATRIC diet as directed by your dietician.  Use showers for bathing as directed by your surgeon.  Schedule a follow-up appointment with your surgeon in 2 weeks. SEEK MEDICAL CARE IF: There is redness, swelling, or increasing pain in the wound.  There is pus coming from the wound.  There is drainage from the wound lasting longer than 1 day.  There is worsening pain in your abdomen An unexplained oral temperature above 102 F (38.9 C) develops.  You notice a foul smell coming from the wound or dressing.  There is a breaking open of the wound (edges not staying together) after  stitches have been removed.  You notice increasing pain in the shoulders (shoulder strap areas).  You develop dizzy episodes or faint while standing.  You develop shortness of breath.  You develop persistent nausea or vomiting. SEEK IMMEDIATE MEDICAL CARE IF: You develop a rash.  You have difficulty breathing.  You develop, or feel you are developing, any reaction or side effects to medications  [name]. Andrey Campanile, MD, FACS General, Bariatric, & Minimally Invasive Surgery [facility], [place] [phone]

## 2011-05-31 NOTE — Discharge Summary (Signed)
Physician Discharge Summary  Patient ID: Jeff Boyle MRN: [id number] DOB/AGE: May 15, 1975 36 y.o.  Admit date: 05/28/2011 Discharge date: 05/31/2011  Admission Diagnoses: Obesity, morbid (more than 100 lbs over ideal weight or BMI > 40)  OSA on CPAP  Metabolic syndrome  Discharge Diagnoses:  Active Problems:  Obesity, morbid (more than 100 lbs over ideal weight or BMI > 40)  OSA on CPAP  Metabolic syndrome   Discharged Condition: good  Hospital Course: The patient underwent LAPAROSCOPIC ROUX-EN-Y GASTRIC BYPASS on 05/28/11. His postoperative course has been unremarkable. He was maintained on perioperative DVT prophylaxis with subcutaneous heparin. On POD 1, he underwent lower extremity duplex scan which was negative. His gastrograffin swallow showed no leak - only some mild reflux. He was started on sips and chips and water which he tolerated. On POD 2, he was advanced to protein shakes which he also tolerated. His hgb did drop a little bit from 13 to 12 so I decided to keep him one more day for observation. He remained afebrile, with normal BP and normal HR.  On POD 3, his vitals were stable. He was tolerating his shakes. His hgb was stable. He was ambulating without difficulty. He was deemed stable for discharge.   Consults: None  Significant Diagnostic Studies: labs: hgb on discharge 12.3, radiology: gastrograffin swallow - no leak  and lower extremity duplex scan - negative for dvt  Treatments: IV hydration, analgesia: acetaminophen, Morphine and roxicet elixir, respiratory therapy: CPAP and surgery: LAPAROSCOPIC ROUX-EN-Y GASTRIC BYPASS & EGD 05/28/11  Discharge Exam: Blood pressure 129/67, pulse 67, temperature 97.8 F (36.6 C), temperature source Oral, resp. rate 19, height 6\' 1"  (1.854 m), weight 434 lb 8 oz (197.088 kg), SpO2 97.00%. BP 129/67  Pulse 67  Temp(Src) 97.8 F (36.6 C) (Oral)  Resp 19  Ht 6\' 1"  (1.854 m)  Wt 434 lb 8 oz (197.088 kg)  BMI 57.33 kg/m2   SpO2 97%  Gen: alert, NAD, non-toxic appearing Pupils: equal, no scleral icterus Pulm: Lungs clear to auscultation, symmetric chest rise CV: regular rate and rhythm Abd: soft, nontender, nondistended. Obese. healing trocar sites. No cellulitis. No incisional hernia Ext: no edema, no calf tenderness Skin: no rash, no jaundice   Disposition: 01-Home or Self Care  Discharge Orders    Future Appointments: Provider: Department: Dept Phone: Center:   06/12/2011 9:30 AM Atilano Ina, MD,FACS [facility] [phone] None     Future Orders Please Complete By Expires   Increase activity slowly      Comments:   Continue to walk/ambulate multiple times throughout the day for the next month   Lifting restrictions      Comments:   Do not lift, push, pull anything >15 pounds for 2 weeks   Driving Restrictions      Comments:   No driving for next 3 days &/or while taking pain med     Medication List  As of 05/31/2011  8:13 AM   STOP taking these medications         EXCEDRIN MIGRAINE 250-250-65 MG per tablet      ibuprofen 200 MG tablet         TAKE these medications         fexofenadine 180 MG tablet   Commonly known as: ALLEGRA   Take 180 mg by mouth daily as needed. For allergies      oxyCODONE-acetaminophen 5-325 MG/5ML solution   Commonly known as: ROXICET   Take 5-10 mLs by mouth  every 4 (four) hours as needed.      protein supplement Powd   Commonly known as: UNJURY VANILLA   Take 7 g (2 oz total) by mouth 4 (four) times daily.           Follow-up Information    Follow up with Atilano Ina, MD,FACS. Schedule an appointment as soon as possible for a visit in 2 weeks.   Contact information:   [employer] [address] [place] [address] [phone]         [name]. Andrey Campanile, MD, FACS General, Bariatric, & Minimally Invasive Surgery [facility], [place]  Signed: Atilano Ina 05/31/2011, 8:13 AM

## 2011-05-31 NOTE — Progress Notes (Signed)
Patient provided with discharge teaching and prescription. Patient verbalized understanding. Patient discharged to home.  

## 2011-06-06 ENCOUNTER — Encounter (HOSPITAL_COMMUNITY): Payer: Self-pay | Admitting: General Surgery

## 2011-06-12 ENCOUNTER — Encounter (INDEPENDENT_AMBULATORY_CARE_PROVIDER_SITE_OTHER): Payer: Self-pay | Admitting: General Surgery

## 2011-06-12 ENCOUNTER — Encounter: Payer: BC Managed Care – PPO | Attending: General Surgery | Admitting: *Deleted

## 2011-06-12 ENCOUNTER — Ambulatory Visit (INDEPENDENT_AMBULATORY_CARE_PROVIDER_SITE_OTHER): Payer: BC Managed Care – PPO | Admitting: General Surgery

## 2011-06-12 ENCOUNTER — Encounter: Payer: Self-pay | Admitting: *Deleted

## 2011-06-12 VITALS — BP 118/82 | HR 88 | Resp 16 | Ht 73.0 in | Wt >= 6400 oz

## 2011-06-12 VITALS — Ht 73.0 in | Wt >= 6400 oz

## 2011-06-12 DIAGNOSIS — Z713 Dietary counseling and surveillance: Secondary | ICD-10-CM | POA: Insufficient documentation

## 2011-06-12 DIAGNOSIS — Z9884 Bariatric surgery status: Secondary | ICD-10-CM

## 2011-06-12 DIAGNOSIS — Z01818 Encounter for other preprocedural examination: Secondary | ICD-10-CM | POA: Insufficient documentation

## 2011-06-12 NOTE — Progress Notes (Addendum)
  Bariatric Class:  Appt start time: 1600 end time:  1700.  2 Week Post-Operative Nutrition Class  Patient was seen on 06/12/2011 for Post-[facility]ative [facility]   Surgery date: 05/28/11 Surgery type: Gastric Bypass  Weight today: 414.5 lbs Weight change: 33.7 lbs Total weight lost: 33.7 lbs BMI: 54.7%  The following the learning objective met the patient during this course:   Identifies Phase 3A (Soft, High Proteins) Dietary Goals and will begin from 2 weeks post-operatively to 2 months post-operatively   Identifies appropriate sources of fluids and proteins   States protein recommendations and appropriate sources post-operatively  Identifies the need for appropriate texture modifications, mastication, and bite sizes when consuming solids  Identifies appropriate multivitamin and calcium sources post-operatively  Describes the need for physical activity post-operatively and will follow MD recommendations  States when to call healthcare provider regarding medication questions or post-operative complications  Handouts given during class include:  Phase 3A: Soft, High Protein Diet Handout  Follow-Up Plan: Patient will follow-up at [facility] in 6 weeks for 2 months post-op nutrition visit for diet advancement per MD.

## 2011-06-12 NOTE — Patient Instructions (Signed)
Keep up the great work.  Start/continue  walking 20-30 minutes several times a week

## 2011-06-12 NOTE — Progress Notes (Signed)
Chief complaint: Postop  Procedure: Status post laparoscopic Roux-en-Y gastric bypass March 18  History of Present Ilness: 36 year old morbidly obese Caucasian male comes in today for his first postoperative appointment. He was discharged on March 21. Since discharge he has been doing very well. He denies any fevers or chills. He denies any need for pain medication. He denies any vomiting or regurgitation. He has had very mild if any nausea. He really denies any abdominal pain. He is still having some loose stool. He denies any trouble swallowing or drinking his protein shakes. He is scheduled to see the nutritionist later today  Physical Exam: BP 118/82  Pulse 88  Resp 16  Ht 6\' 1"  (1.854 m)  Wt 415 lb (188.243 kg)  BMI 54.75 kg/m2  Gen: alert, NAD, non-toxic appearing Pupils: equal, no scleral icterus Pulm: Lungs clear to auscultation, symmetric chest rise CV: regular rate and rhythm Abd: soft, nontender, nondistended. Well-healed trocar sites. No cellulitis. No incisional hernia Ext: no edema, no calf tenderness Skin: no rash, no jaundice  Assessment and Plan: Status post laparoscopic Roux-en-Y gastric bypass  I believe is doing very well. He has lost 33 pounds since surgery. I encouraged him to start exercising. We will refer him to the belt program. Followup 2-3 weeks  [name]. Andrey Campanile, MD, FACS General, Bariatric, & Minimally Invasive Surgery [facility], [place]

## 2011-06-12 NOTE — Patient Instructions (Signed)
Patient to follow Phase 3A-Soft, High Protein Diet and follow-up at [facility] in 6 weeks for 2 months post-op nutrition visit for diet advancement. 

## 2011-06-13 ENCOUNTER — Ambulatory Visit (INDEPENDENT_AMBULATORY_CARE_PROVIDER_SITE_OTHER): Payer: Self-pay | Admitting: General Surgery

## 2011-07-02 ENCOUNTER — Encounter (INDEPENDENT_AMBULATORY_CARE_PROVIDER_SITE_OTHER): Payer: Self-pay | Admitting: General Surgery

## 2011-07-02 ENCOUNTER — Ambulatory Visit (INDEPENDENT_AMBULATORY_CARE_PROVIDER_SITE_OTHER): Payer: BC Managed Care – PPO | Admitting: General Surgery

## 2011-07-02 VITALS — BP 112/78 | HR 72 | Resp 16 | Ht 73.0 in | Wt >= 6400 oz

## 2011-07-02 DIAGNOSIS — Z9884 Bariatric surgery status: Secondary | ICD-10-CM

## 2011-07-02 DIAGNOSIS — Z09 Encounter for follow-up examination after completed treatment for conditions other than malignant neoplasm: Secondary | ICD-10-CM

## 2011-07-02 NOTE — Patient Instructions (Signed)
Can take Miralax as needed for constipation Start walking 3-4 times per week for 30 minutes Consider getting a pedometer to track your steps. Set a goal (ie 10,000 steps/day)

## 2011-07-02 NOTE — Progress Notes (Signed)
Chief complaint: One month followup  Procedure: Status post laparoscopic Roux-en-Y gastric bypass March 18  History of Present Ilness: 36 year old male comes in today for his one month postop appointment. He states that overall he has done well since his last visit which was on April 2. He had an episode of left lower quadrant sharp pain over a week ago. It lasted for about 2 days. It was intermittent. He had done some heavy lifting getting their house ready for a party prior to developing the pain. He no longer has the pain. He hasn't had anything like this for over a week and a half now. He denies any fevers or chills. He denies any melena or hematochezia. He has had a bit of constipation. His last bowel movement was last Thursday. He has had some straining as well. He has also had some intermittent nausea. He attributes that to trying new foods. He states that he tried some chopped barbecue with vinegar sauce. This caused him to have nausea and an episode of regurgitation. He states that he has done very little exercise.  Physical Exam: BP 112/78  Pulse 72  Resp 16  Ht 6\' 1"  (1.854 m)  Wt 405 lb (183.707 kg)  BMI 53.43 kg/m2  Gen: alert, NAD, non-toxic appearing Pupils: equal, no scleral icterus Pulm: Lungs clear to auscultation, symmetric chest rise CV: regular rate and rhythm Abd: soft, nontender, nondistended. Well-healed trocar sites. No cellulitis. No incisional hernia Ext: no edema, no calf tenderness Skin: no rash, no jaundice  Assessment and Plan: Status post laparoscopic Roux-en-Y gastric bypass.  Total weight loss since surgery 43 pounds. Weight loss since last visit 10 pounds  I advised him he can take MiraLax for constipation. Since the left lower quadrant pain is resolved, my assumption is that it is due to strenuous activity and some abdominal wall strain.  I advised him to start exercising. He is going to start the BELT program in 2 weeks. In the interim, I  encouraged him to start walking on a daily basis.  I advised him he could have small sips of water while eating  Followup 3 months  Michelle Wnek M. Andrey Campanile, MD, FACS General, Bariatric, & Minimally Invasive Surgery [facility], [place]

## 2011-07-23 ENCOUNTER — Encounter: Payer: Self-pay | Admitting: *Deleted

## 2011-07-23 ENCOUNTER — Encounter: Payer: BC Managed Care – PPO | Attending: General Surgery | Admitting: *Deleted

## 2011-07-23 VITALS — Ht 73.0 in | Wt 390.0 lb

## 2011-07-23 DIAGNOSIS — Z713 Dietary counseling and surveillance: Secondary | ICD-10-CM | POA: Insufficient documentation

## 2011-07-23 DIAGNOSIS — Z01818 Encounter for other preprocedural examination: Secondary | ICD-10-CM | POA: Insufficient documentation

## 2011-07-23 DIAGNOSIS — Z9884 Bariatric surgery status: Secondary | ICD-10-CM

## 2011-07-23 NOTE — Progress Notes (Signed)
  Follow-up visit:  8 Weeks Post-Operative RYGB Surgery  Medical Nutrition Therapy:  Appt start time: 0800 end time:  0830.  Primary concerns today:  Post-operative bariatric surgery nutrition management. Jeff Boyle is here today for 2 month f/u. Reports only 1 instance of regurgitation with a fast food hamburger he ate to fast. Doing very well; down 58.2 lbs.  Surgery date: 05/28/11 Surgery type: Gastric Bypass Start weight at [facility]: 448.2 lbs  Weight today: 390.0 lbs Weight change: 24.5 lbs Total weight lost: 58.2 lbs BMI: 51.5 mg/k^2   TANITA  BODY COMP RESULTS  07/23/11    %Fat 45.2%    Fat Mass (lbs) 176.5    Fat Free Mass (lbs) 213.5    Total Body Water (lbs) 156.5   24-hr recall: B (AM): Protein shake (~30g) Snk (AM): n/a  L (PM): [place] (2-3 oz), cheese (1 oz) Snk (PM): n/a  D (PM): 3 oz lean protein Snk (PM): n/a  Fluid intake: 45-50 oz Estimated total protein intake: 85-90 g  Medications: None Supplementation: Taking regularly as instructed  Using straws: No Drinking while eating: No Hair loss: No Carbonated beverages: No N/V/D/C: Some regurgitation with fast food hamburger - believes he ate too fast Dumping syndrome: None reported  Recent physical activity:  None - plans to start training today for a "Couch to 5K" at the end of the summer.   Progress Towards Goal(s):  In progress.  Handouts given during visit include:  Phase 3B: High Protein + Non-Starchy Vegetables  Nutritional Diagnosis:  [place]-3.3 Overweight/obesity As related to recent RYGB surgery.  As evidenced by patient following post-op nutrition guidelines for continued weight loss.    Intervention:  Nutrition education/reinforcement.  Monitoring/Evaluation:  Dietary intake, exercise, and body weight. Follow up in 1 month for 3 month post-op visit.

## 2011-07-23 NOTE — Patient Instructions (Addendum)
Goals:  Follow Phase 3B: High Protein + Non-Starchy Vegetables  Eat 3-6 small meals/snacks, every 3-5 hrs  Continue lean protein foods to meet 80g goal  Increase fluid intake to 64oz +  Avoid drinking 15 minutes before, during and 30 minutes after eating  Aim for >30 min of physical activity daily

## 2011-08-24 ENCOUNTER — Ambulatory Visit: Payer: BC Managed Care – PPO | Admitting: *Deleted

## 2011-09-04 ENCOUNTER — Encounter: Payer: BC Managed Care – PPO | Attending: General Surgery | Admitting: *Deleted

## 2011-09-04 ENCOUNTER — Encounter: Payer: Self-pay | Admitting: *Deleted

## 2011-09-04 DIAGNOSIS — Z713 Dietary counseling and surveillance: Secondary | ICD-10-CM | POA: Insufficient documentation

## 2011-09-04 DIAGNOSIS — Z01818 Encounter for other preprocedural examination: Secondary | ICD-10-CM | POA: Insufficient documentation

## 2011-09-04 NOTE — Patient Instructions (Addendum)
Goals:  Follow Phase 3B: High Protein + Non-Starchy Vegetables  Eat 3-6 small meals/snacks, every 3-5 hrs  Increase lean protein foods to meet 80g goal  Continue fluid intake of 64oz +  Avoid drinking 15 minutes before, during and 30 minutes after eating  Resume protein shake daily or consume additional ~30 g of solids (protein).

## 2011-09-04 NOTE — Progress Notes (Signed)
  Follow-up visit:  3 Month Post-Operative RYGB Surgery  Medical Nutrition Therapy:  Appt start time: 1200   end time:  1245.  Primary concerns today:  Post-operative bariatric surgery nutrition management. Jeff Boyle is here today for 3 month f/u with a 26.5 lb loss in fat mass since last visit. Reports he has not been eating breakfast for the last few weeks, but states he will resume d/t increase in hair loss. Recommended starting 1000 mcg biotin if needed; advised he consult Dr. Andrey Campanile or PCP before adding. Started BELT program last week and in exercise group.  Reports no other problems or issues at this time.   Surgery date: 05/28/11 Surgery type: Gastric Bypass Start weight at [facility]: 448.2 lbs  Weight today: 367.5.0 lbs Weight change: 22.5 lbs Total weight lost: 80.7 lbs BMI: 48.5 kg/m^2 Weight goal: 230-250 lbs % goal met: 38%   TANITA  BODY COMP RESULTS  07/23/11 09/04/11    %Fat 45.2% 41.0%    Fat Mass (lbs) 176.5 150.0    Fat Free Mass (lbs) 213.5 217.0    Total Body Water (lbs) 156.5 159.0   24-hr recall: B (AM): None Snk (AM): Cashew (1-2 oz)  L (PM): [place] or chicken (2-3 oz), cheese (1 oz) Snk (PM): n/a  D (PM): 3 oz lean protein, vegetables Snk (PM): n/a  Fluid intake: 45-50 oz Estimated total protein intake: 55-60 g  Medications: None Supplementation: Taking regularly as instructed  Using straws: No Drinking while eating: No Hair loss: Yes - d/t decreased protein intake in last few weeks Carbonated beverages: No N/V/D/C: None Dumping syndrome: None reported  Recent physical activity:  Started BELT program last week; some walking with wife  Progress Towards Goal(s):  In progress.  Nutritional Diagnosis:  [place]-3.3 Overweight/obesity As related to recent RYGB surgery.  As evidenced by patient following post-op nutrition guidelines for continued weight loss.    Intervention:  Nutrition education/reinforcement.  Monitoring/Evaluation:  Dietary intake, exercise,  and body weight. Follow up in 3 months for 6 month post-op visit.

## 2011-09-16 ENCOUNTER — Encounter (HOSPITAL_COMMUNITY): Payer: Self-pay | Admitting: *Deleted

## 2011-09-16 ENCOUNTER — Emergency Department (INDEPENDENT_AMBULATORY_CARE_PROVIDER_SITE_OTHER)
Admission: EM | Admit: 2011-09-16 | Discharge: 2011-09-16 | Disposition: A | Discharge status: Home / Self Care | Payer: BC Managed Care – PPO | Source: Home / Self Care | Attending: Family Medicine | Admitting: Family Medicine

## 2011-09-16 DIAGNOSIS — S335XXA Sprain of ligaments of lumbar spine, initial encounter: Secondary | ICD-10-CM

## 2011-09-16 DIAGNOSIS — S39012A Strain of muscle, fascia and tendon of lower back, initial encounter: Secondary | ICD-10-CM

## 2011-09-16 MED ORDER — KETOROLAC TROMETHAMINE 30 MG/ML IJ SOLN
INTRAMUSCULAR | Status: AC
  Filled 2011-09-16: qty 1

## 2011-09-16 MED ORDER — TRAMADOL HCL 50 MG PO TABS: 50.0000 mg | ORAL_TABLET | Freq: Four times a day (QID) | ORAL | Status: AC | PRN

## 2011-09-16 MED ORDER — CYCLOBENZAPRINE HCL 5 MG PO TABS: 5.0000 mg | ORAL_TABLET | Freq: Three times a day (TID) | ORAL | Status: AC | PRN

## 2011-09-16 MED ORDER — KETOROLAC TROMETHAMINE 30 MG/ML IJ SOLN
30.0000 mg | Freq: Once | INTRAMUSCULAR | Status: AC
  Administered 2011-09-16: 30 mg via INTRAMUSCULAR

## 2011-09-16 NOTE — ED Provider Notes (Signed)
History     CSN: [id number]  Arrival date & time 09/16/11  1149   First MD Initiated Contact with Patient 09/16/11 1153      Chief Complaint  Patient presents with  . Back Pain    (Consider location/radiation/quality/duration/timing/severity/associated sxs/prior treatment) Patient is a 36 y.o. male presenting with back pain. The history is provided by the patient and the spouse.  Back Pain  This is a new problem. The current episode started 12 to 24 hours ago. The problem has not changed since onset.The pain is associated with lifting heavy objects (lifting a heavy bucket awkwardly and felt sudden left low back pain). The pain is present in the lumbar spine. The quality of the pain is described as shooting. The pain radiates to the left thigh. The pain is moderate. The symptoms are aggravated by bending and certain positions. Associated symptoms include leg pain. Pertinent negatives include no abdominal pain, no bowel incontinence, no perianal numbness and no bladder incontinence. Risk factors include obesity.    Past Medical History  Diagnosis Date  . Hypogonadism male   . Psoriasis   . OSA on CPAP   . Morbid obesity   . Headache   . Psoriasis   . Sleep apnea     C PAP  . Allergy     Past Surgical History  Procedure Date  . Hernia repair     as a child- RIH  . Breath tek h pylori 04/06/2011    Procedure: BREATH TEK H PYLORI;  Surgeon: Atilano Ina, MD;  Location: [facility];  Service: General;  Laterality: N/A;  . 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 Diabetes Mother   . Cancer Maternal Grandmother     LUNG  . Heart disease Maternal Grandfather     History  Substance Use Topics  . Smoking status: Former Smoker    Quit date: 05/22/2006  . Smokeless tobacco: Never Used    . Alcohol Use: No     1-2 DRINKS PER WEEK      Review of Systems  Constitutional: Negative.   Gastrointestinal: Negative.  Negative for abdominal pain and bowel incontinence.  Genitourinary: Negative for bladder incontinence.  Musculoskeletal: Positive for back pain. Negative for joint swelling and gait problem.  Neurological: Negative.     Allergies  Review of patient's allergies indicates no known allergies.  Home Medications   Current Outpatient Rx  Name Route Sig Dispense Refill  . CALCIUM CITRATE +D PO Oral Take 500 mg by mouth 3 (three) times daily.    . B-12 500 MCG SL SUBL Sublingual Place 1 mg under the tongue daily.    . MULTIVITAMINS PO CAPS Oral Take 2 capsules by mouth daily.    Marland Kitchen UNJURY VANILLA POWDER Oral Take 7 g (2 oz total) by mouth 4 (four) times daily.    . CYCLOBENZAPRINE HCL 5 MG PO TABS Oral Take 1 tablet (5 mg total) by mouth 3 (three) times daily as needed for muscle spasms. 30 tablet 0  . KRILL OIL PO Oral Take 500 mg by mouth 2 (two) times daily.    . TRAMADOL HCL 50 MG PO TABS Oral Take 1 tablet (50 mg total) by mouth every 6 (six) hours as needed for pain. 15 tablet 0  BP 99/67  Pulse 86  Temp 98.3 F (36.8 C) (Oral)  Resp 20  SpO2 98%  Physical Exam  Nursing note and vitals reviewed. Constitutional: He is oriented to person, place, and time. He appears well-developed and well-nourished.  Abdominal: Soft. Bowel sounds are normal. There is no tenderness.  Musculoskeletal: He exhibits tenderness.       Lumbar back: He exhibits decreased range of motion, tenderness and spasm. He exhibits no bony tenderness, no swelling and normal pulse.       Back:  Neurological: He is alert and oriented to person, place, and time.  Skin: Skin is warm and dry.    ED Course  Procedures (including critical care time)  Labs Reviewed - No data to display No results found.   1. Strain of lumbar paraspinal muscle       MDM          Linna Hoff, MD 09/16/11 1308

## 2011-09-16 NOTE — ED Notes (Signed)
Reports lifting 80# bucket yesterday when he felt sudden onset pain in left low back.  Pain radiates into left buttock and LLE.  C/O numbness radiating down into left calf.  Denies any incontinence.  Has taken Tylenol and was able to sleep well.  Patient standing during assessment.

## 2011-10-04 ENCOUNTER — Ambulatory Visit (INDEPENDENT_AMBULATORY_CARE_PROVIDER_SITE_OTHER): Payer: BC Managed Care – PPO | Admitting: General Surgery

## 2011-10-10 ENCOUNTER — Encounter (INDEPENDENT_AMBULATORY_CARE_PROVIDER_SITE_OTHER): Payer: Self-pay | Admitting: General Surgery

## 2011-10-10 ENCOUNTER — Ambulatory Visit (INDEPENDENT_AMBULATORY_CARE_PROVIDER_SITE_OTHER): Payer: BC Managed Care – PPO | Admitting: General Surgery

## 2011-10-10 VITALS — BP 116/82 | HR 72 | Temp 97.4°F | Ht 73.0 in | Wt 357.0 lb

## 2011-10-10 DIAGNOSIS — Z9884 Bariatric surgery status: Secondary | ICD-10-CM

## 2011-10-10 NOTE — Patient Instructions (Signed)
Keep up the excellent work!!  

## 2011-10-14 NOTE — Progress Notes (Signed)
Subjective:     Patient ID: Jeff Boyle, male   DOB: 1975/06/03, 36 y.o.   MRN: [id number]  HPI 36 year old morbidly obese Caucasian male comes in for his 3 month followup after undergoing laparoscopic Roux-en-Y gastric bypass surgery on March 18. He states that he has been doing very well since surgery and since the last time he was seen which was on April 22. He did end up going to the emergency department on July 7 for back pain. He had been moving some items around the house and pulled a muscle. He denies any fever, chills, regurgitation, abdominal pain, dumping syndrome, diarrhea. He does report a little but of hair thining but he has recently increased his protein intake. He has noticed that he does have a little bit of trouble with eating rice which causes some nausea. He states that he is getting ready to run a 5K  PMHx, PSHx, SOCHx, FAMHx, ALL reviewed and unchanged  Review of Systems 10 point review of systems was performed all systems are negative except for what is mentioned in the history of present illness    Objective:   Physical Exam BP 116/82  Pulse 72  Temp 97.4 F (36.3 C) (Temporal)  Ht 6\' 1"  (1.854 m)  Wt 357 lb (161.934 kg)  BMI 47.10 kg/m2  SpO2 97%  Gen: alert, NAD, non-toxic appearing, morbidly obese Pupils: equal, no scleral icterus Pulm: Lungs clear to auscultation, symmetric chest rise CV: regular rate and rhythm Abd: soft, nontender, nondistended. Well-healed trocar sites. No cellulitis. No incisional hernia Ext: no edema, no calf tenderness Skin: no rash, no jaundice     Assessment:     Status post laparoscopic Roux-en-Y gastric bypass Morbid obesity Obstructive sleep apnea    Plan:     His preoperative weight was 448 pounds. His weight today is 357 pounds. I congratulated him on his weight loss. I'm very happy with his progress. He is currently participating in the belt program. He is enjoying the belt program. I encouraged him to continue  with his exercise once he completes that program. I stressed the importance of adequate protein intake. Followup 3 months  [name]. Andrey Campanile, MD, FACS General, Bariatric, & Minimally Invasive Surgery [facility], [place]

## 2011-12-10 ENCOUNTER — Encounter: Payer: Self-pay | Admitting: *Deleted

## 2011-12-10 ENCOUNTER — Encounter: Payer: BC Managed Care – PPO | Attending: General Surgery | Admitting: *Deleted

## 2011-12-10 DIAGNOSIS — Z9884 Bariatric surgery status: Secondary | ICD-10-CM | POA: Insufficient documentation

## 2011-12-10 DIAGNOSIS — Z713 Dietary counseling and surveillance: Secondary | ICD-10-CM | POA: Insufficient documentation

## 2011-12-10 DIAGNOSIS — Z09 Encounter for follow-up examination after completed treatment for conditions other than malignant neoplasm: Secondary | ICD-10-CM | POA: Insufficient documentation

## 2011-12-10 NOTE — Patient Instructions (Addendum)
Goals:  Continue follow Phase 3B: High Protein + Non-Starchy Vegetables  Increase lean protein foods to meet 60-80g goal  Continue fluid intake of 64oz +  Avoid drinking 15 minutes before, during and 30 minutes after eating  Continue protein shake daily or consume additional ~30 g of solids (protein).   Watch carb portions.  Resume daily supplements; Try Citracal Calcium Petites and FreedaVite MVI.

## 2011-12-10 NOTE — Progress Notes (Signed)
  Follow-up visit:  6 Month Post-Operative RYGB Surgery  Medical Nutrition Therapy:  Appt start time:  800  End time:  830.  Primary concerns today:  Post-operative bariatric surgery nutrition management. Jeff Boyle is here today for 6 month f/u with an additional 23.5 lb wt loss. Reports he has not been exercising since finishing the BELT program 2 weeks ago, but starts a 3 mos [employer] program this week. Has been eating high CHO bars (Larabar, KIND bars) not realizing they could slow his wt loss. Suggested eating as snack before working out if he cannot d/c them. Hair loss resolving, though continues low pro and fluids. Discussed ways to increase.   Surgery date: 05/28/11 Surgery type: Gastric Bypass Start weight at [facility]: 448.2 lbs  Weight today: 344.0 lbs Weight change: 23.5 lbs Total weight lost: 104.2 lbs BMI: 45.4 kg/m^2  Weight goal: 230-250 lbs % goal met: 48-53%   TANITA  BODY COMP RESULTS  07/23/11 09/04/11 12/10/11    %Fat 45.2% 41.0% 39.0%    Fat Mass (lbs) 176.5 150.0 134.0    Fat Free Mass (lbs) 213.5 217.0 210.0    Total Body Water (lbs) 156.5 159.0 153.5   24-hr recall: B (AM): Pro shake (30g) or cereal w/ almond milk or Kind Bar Snk (AM): Cashew (1-2 oz) -- (8g) L (PM): Black bean soup (1-1.5 c) OR taco soup (10g) Snk (PM): Larabar or Kind Bar (HIGH CHO!!) -- (5-6g) D (PM): Pesto (black bean) pasta w/ kale, vegetables (5g) Snk (PM): n/a  Fluid intake: 11 oz shake, 26 oz water, other 10 oz: 45-50 oz Estimated total protein intake: 55-60 g  Medications: None Supplementation: Taking inconsistently. Tired of chalky calcium and MVI. Gave samples of Freedavite and told him about Citracal Petites; both small enough to swallow.  Using straws: No Drinking while eating: No Hair loss: Yes - resolving. Taking biotin Carbonated beverages: No N/V/D/C: None Dumping syndrome: None reported  Recent physical activity:  Finished BELT program last week; no exercise the last 2  weeks. Scheduled to start 3 months of CrossFit this week  Progress Towards Goal(s):  In progress.  Nutritional Diagnosis:  [place]-3.3 Overweight/obesity As related to recent RYGB surgery.  As evidenced by patient following post-op nutrition guidelines for continued weight loss.    Intervention:  Nutrition education/reinforcement.  Monitoring/Evaluation:  Dietary intake, exercise, and body weight. Follow up in 6 months for 12 month post-op visit.

## 2011-12-26 ENCOUNTER — Other Ambulatory Visit (HOSPITAL_COMMUNITY): Payer: Self-pay | Admitting: Urology

## 2011-12-26 DIAGNOSIS — D497 Neoplasm of unspecified behavior of endocrine glands and other parts of nervous system: Secondary | ICD-10-CM

## 2011-12-27 ENCOUNTER — Ambulatory Visit (HOSPITAL_COMMUNITY)
Admission: RE | Admit: 2011-12-27 | Discharge: 2011-12-27 | Disposition: A | Discharge status: Home / Self Care | Payer: BC Managed Care – PPO | Source: Ambulatory Visit | Attending: Urology | Admitting: Urology

## 2011-12-27 ENCOUNTER — Inpatient Hospital Stay (HOSPITAL_COMMUNITY): Admission: RE | Admit: 2011-12-27 | Payer: BC Managed Care – PPO | Source: Ambulatory Visit

## 2011-12-27 DIAGNOSIS — J3489 Other specified disorders of nose and nasal sinuses: Secondary | ICD-10-CM | POA: Insufficient documentation

## 2011-12-27 DIAGNOSIS — E229 Hyperfunction of pituitary gland, unspecified: Secondary | ICD-10-CM | POA: Insufficient documentation

## 2011-12-27 MED ORDER — GADOBENATE DIMEGLUMINE 529 MG/ML IV SOLN
15.0000 mL | Freq: Once | INTRAVENOUS | Status: AC | PRN
  Administered 2011-12-27: 15 mL via INTRAVENOUS

## 2012-01-09 ENCOUNTER — Telehealth: Payer: Self-pay | Admitting: Family Medicine

## 2012-01-09 NOTE — Telephone Encounter (Signed)
FAX EVERYTHING OVER CPAP SUPPLY REQUEST PER THE PATIENTS REQUEST. CLS

## 2012-01-10 ENCOUNTER — Encounter (INDEPENDENT_AMBULATORY_CARE_PROVIDER_SITE_OTHER): Payer: Self-pay | Admitting: General Surgery

## 2012-01-10 ENCOUNTER — Ambulatory Visit (INDEPENDENT_AMBULATORY_CARE_PROVIDER_SITE_OTHER): Payer: BC Managed Care – PPO | Admitting: General Surgery

## 2012-01-10 VITALS — BP 123/80 | HR 74 | Temp 98.5°F | Resp 16 | Ht 73.0 in | Wt 342.0 lb

## 2012-01-10 DIAGNOSIS — Z09 Encounter for follow-up examination after completed treatment for conditions other than malignant neoplasm: Secondary | ICD-10-CM

## 2012-01-10 DIAGNOSIS — Z9884 Bariatric surgery status: Secondary | ICD-10-CM

## 2012-01-10 DIAGNOSIS — Z6841 Body Mass Index (BMI) 40.0 and over, adult: Secondary | ICD-10-CM

## 2012-01-10 LAB — COMPREHENSIVE METABOLIC PANEL
Albumin: 4.4 g/dL (ref 3.5–5.2)
BUN: 12 mg/dL (ref 6–23)
Calcium: 9.4 mg/dL (ref 8.4–10.5)
Chloride: 104 mEq/L (ref 96–112)
Creat: 0.74 mg/dL (ref 0.50–1.35)
Glucose, Bld: 81 mg/dL (ref 70–99)
Potassium: 4.5 mEq/L (ref 3.5–5.3)

## 2012-01-10 LAB — CBC WITH DIFFERENTIAL/PLATELET
Basophils Relative: 1 % (ref 0–1)
Eosinophils Relative: 3 % (ref 0–5)
HCT: 40.7 % (ref 39.0–52.0)
Hemoglobin: 14.3 g/dL (ref 13.0–17.0)
Lymphocytes Relative: 33 % (ref 12–46)
MCHC: 35.1 g/dL (ref 30.0–36.0)
MCV: 82.9 fL (ref 78.0–100.0)
Monocytes Absolute: 0.5 10*3/uL (ref 0.1–1.0)
Monocytes Relative: 10 % (ref 3–12)
Neutro Abs: 2.9 10*3/uL (ref 1.7–7.7)
RDW: 14.1 % (ref 11.5–15.5)

## 2012-01-10 LAB — IRON: Iron: 154 ug/dL (ref 42–165)

## 2012-01-10 LAB — IBC PANEL
%SAT: 39 % (ref 20–55)
TIBC: 400 ug/dL (ref 215–435)
UIBC: 246 ug/dL (ref 125–400)

## 2012-01-10 LAB — LIPID PANEL
Cholesterol: 150 mg/dL (ref 0–200)
HDL: 33 mg/dL — ABNORMAL LOW (ref >39)
Triglycerides: 115 mg/dL (ref <150)

## 2012-01-10 NOTE — Progress Notes (Signed)
Subjective:     Patient ID: Jeff Boyle, male   DOB: 1976/02/21, 36 y.o.   MRN: [id number]  HPI 36 year old morbidly obese Caucasian male comes in for six-month followup after undergoing laparoscopic Roux-en-Y gastric bypass on 05/23/2011. I last saw him in the office on July 31. At that time he weighed 357 pounds with a BMI of 47.1. Since his last visit he states that he has ran 5K run. He states that he is getting ready to run another 5K in mid November. He is also recently joined [employer] about 3 weeks ago. He states that he has a lot of stress in his life right now. As a result he made some inappropriate food choices over the past several weeks. He denies any nausea, vomiting, indigestion, regurgitation, dumping, melena or hematochezia. He denies any additional hair loss. He states that he has been taking his supplements. His flatulence is minimal. He is still using his CPAP machine.  He states that he saw his urologist recently and was given a prescription for testosterone for his hypogonadism  PMHx, PSHx, SOCHx, FAMHx, ALL reviewed and unchanged   Review of Systems 10 point ROS performed and negative except for what is mentioned above    Objective:   Physical Exam BP 123/80  Pulse 74  Temp 98.5 F (36.9 C) (Temporal)  Resp 16  Ht 6\' 1"  (1.854 m)  Wt 342 lb (155.13 kg)  BMI 45.12 kg/m2  Gen: alert, NAD, non-toxic appearing Pupils: equal, no scleral icterus Pulm: Lungs clear to auscultation, symmetric chest rise CV: regular rate and rhythm Abd: soft, nontender, nondistended. Well-healed trocar sites. No cellulitis. No incisional hernia Ext: no edema, no calf tenderness Skin: no rash, no jaundice     Assessment:     Status post laparoscopic Roux-en-Y gastric bypass March 13 Obstructive sleep apnea-still using CPAP Morbid obesity-BMI 45.34 Total weight loss 106 pounds    Plan:     I congratulated him on his weight loss to date. I encouraged him to continue  taking his supplements. I also encouraged him to continue with routine regular exercise. We also discussed the importance of proper food choices in order to maximize his weight loss. We discussed habits to deal with his stress. He is due for 6 month lab check. I am very pleased with his progress. Followup 3 months  [name]. Andrey Campanile, MD, FACS General, Bariatric, & Minimally Invasive Surgery [facility], [place]

## 2012-01-10 NOTE — Patient Instructions (Signed)
Continue exercising regularly Monitor what you eat Keep up the good work  Get your labs drawn

## 2012-01-11 LAB — VITAMIN D 25 HYDROXY (VIT D DEFICIENCY, FRACTURES): Vit D, 25-Hydroxy: 20 ng/mL — ABNORMAL LOW (ref 30–89)

## 2012-03-01 ENCOUNTER — Other Ambulatory Visit (INDEPENDENT_AMBULATORY_CARE_PROVIDER_SITE_OTHER): Payer: Self-pay | Admitting: General Surgery

## 2012-03-01 ENCOUNTER — Telehealth (INDEPENDENT_AMBULATORY_CARE_PROVIDER_SITE_OTHER): Payer: Self-pay | Admitting: General Surgery

## 2012-03-01 NOTE — Telephone Encounter (Signed)
Vit D level low at 20. Called pt and instructed him to get Vitalady tender D3-50 (50,000 IU) pills online. Take 50,000 IU of D3 weekly x 8weeks. Will repeat vit d level in 8 weeks. If unable to find it on-line, contact office and it will be prescribe to you.   [name]. Andrey Campanile, MD, FACS General, Bariatric, & Minimally Invasive Surgery [facility], [place]

## 2012-04-03 ENCOUNTER — Ambulatory Visit (INDEPENDENT_AMBULATORY_CARE_PROVIDER_SITE_OTHER): Payer: BC Managed Care – PPO | Admitting: General Surgery

## 2012-05-08 ENCOUNTER — Ambulatory Visit (INDEPENDENT_AMBULATORY_CARE_PROVIDER_SITE_OTHER): Payer: Self-pay | Admitting: General Surgery

## 2012-06-04 ENCOUNTER — Ambulatory Visit (INDEPENDENT_AMBULATORY_CARE_PROVIDER_SITE_OTHER): Payer: BC Managed Care – PPO | Admitting: General Surgery

## 2012-06-04 ENCOUNTER — Telehealth (INDEPENDENT_AMBULATORY_CARE_PROVIDER_SITE_OTHER): Payer: Self-pay | Admitting: General Surgery

## 2012-06-04 ENCOUNTER — Encounter (INDEPENDENT_AMBULATORY_CARE_PROVIDER_SITE_OTHER): Payer: Self-pay | Admitting: General Surgery

## 2012-06-04 VITALS — BP 128/68 | HR 77 | Temp 97.6°F | Resp 18 | Ht 73.0 in | Wt 344.0 lb

## 2012-06-04 DIAGNOSIS — Z09 Encounter for follow-up examination after completed treatment for conditions other than malignant neoplasm: Secondary | ICD-10-CM

## 2012-06-04 DIAGNOSIS — Z9884 Bariatric surgery status: Secondary | ICD-10-CM

## 2012-06-04 DIAGNOSIS — M7989 Other specified soft tissue disorders: Secondary | ICD-10-CM

## 2012-06-04 NOTE — Patient Instructions (Signed)
Look into MyFitnessPal app and track your calories Work on eliminating sweet tea - start with mixing with unsweet tea Need to work on getting more active again

## 2012-06-04 NOTE — Telephone Encounter (Signed)
Left message on patient voicemail for Xray of foot date is 06/06/12 anytime before 4pm

## 2012-06-04 NOTE — Progress Notes (Signed)
Subjective:     Patient ID: Jeff Boyle, male   DOB: 21-May-1975, 37 y.o.   MRN: [id number]  HPI 37 year old Caucasian male comes in for his one-year checkup after undergoing laparoscopic Roux-en-Y gastric bypass on 05/28/2011. He was last seen in the office on 01/09/2012. At that time he weighed 342.2 pounds. Today he weighs 344 pounds. He states that for the past several months he has been struggling making proper food choices. He states that he has been eating more carbohydrates lately. He states that he has been eating rice, pasta, and bread. He states he's also been eating potato chips. He also states that he probably drinks one cup of sweet tea every other day. He also states that his portion sizes probably increased. He states that he has not been exercising that regularly. About a month ago he stubbed his right second toe in the middle the night and still has some persistent discomfort while walking.  He denies abdominal pain, nausea, vomiting, heartburn, or hair loss, diarrhea, paresthesias, bloating, or flatulence. He is having a bowel movement every day  He is no longer using CPAP  PMHx, PSHx, SOCHx, FAMHx, ALL reviewed    Review of Systems 10 point ROS negative except for above    Objective:   Physical Exam BP 128/68  Pulse 77  Temp(Src) 97.6 F (36.4 C) (Temporal)  Resp 18  Ht 6\' 1"  (1.854 m)  Wt 344 lb (156.037 kg)  BMI 45.4 kg/m2  Gen: alert, NAD, non-toxic appearing Pupils: equal, no scleral icterus Pulm: Lungs clear to auscultation, symmetric chest rise CV: regular rate and rhythm Abd: soft, nontender, nondistended. Well-healed trocar sites. No cellulitis. No incisional hernia Ext: no edema, no calf tenderness; Rtight 2nd toe - bruising at joint and TTP. No significant swelling.  Skin: no rash, no jaundice     Assessment:     S/p LRYGB - total weight loss 104 pounds OSA - resolved  Vit D Def - will check labs    Plan:     Unfortunately he has not lost  any additional weight. I think this primarily reflects his poor food choices and lack of physical activity. I stressed the importance of making good food choices. We discussed strategies to wean himself off sweet tea such as mixing it with unsweet tea gradually and then eventually just drinking unsweet tea. I explained that sweet tea is a large source of empty calories. We discussed portion control. He has an appointment with the nutritionist next week which I stressed that he should keep. He appears to be motivated to get back on pathway. We discussed using a Smart phone applications such as MyFitnessPal to track his calories.    He is due for surveillance labs. We will also check an x-ray of his right foot to evaluate his right toe. I stressed the importance of becoming more active again.  Since his weight has plateaued I want to see him back in 3 months as opposed to a year  [name]. Andrey Campanile, MD, FACS General, Bariatric, & Minimally Invasive Surgery [facility], [place]

## 2012-06-09 ENCOUNTER — Encounter: Payer: Self-pay | Admitting: *Deleted

## 2012-06-09 ENCOUNTER — Encounter: Payer: BC Managed Care – PPO | Attending: General Surgery | Admitting: *Deleted

## 2012-06-09 DIAGNOSIS — Z713 Dietary counseling and surveillance: Secondary | ICD-10-CM | POA: Insufficient documentation

## 2012-06-09 DIAGNOSIS — E669 Obesity, unspecified: Secondary | ICD-10-CM | POA: Insufficient documentation

## 2012-06-09 NOTE — Progress Notes (Signed)
  Follow-up visit:  12 Month Post-Operative RYGB Surgery  Medical Nutrition Therapy:  Appt start time:  800  End time:  830.  Primary concerns today:  Post-operative bariatric surgery nutrition management. Michele Mcalpine is here today for 12 month f/u with an additional  lb wt loss, though his FAT MASS has increased by ~ 16 lbs. Reports he has been eating Tostitos chips and large portions of nuts as well as drinking sweet tea.  Drinks normally with meals and skips breakfast most days.  Not currently exercising. Discussed tracking intake via MFP and making me a "friend" so I can view his diary.   Surgery date: 05/28/11 Surgery type: Gastric Bypass Start weight at [facility]: 448.2 lbs  Weight today: 340.5 lbs Weight change: 3.5 lbs Total weight lost: 107.7 lbs BMI: 44.9 kg/m^2  Weight goal: 230-250 lbs % goal met: 49-54%   TANITA  BODY COMP RESULTS  07/23/11 09/04/11 12/10/11 06/09/12    Fat Mass (lbs) 176.5 150.0 134.0 150.5    Fat Free Mass (lbs) 213.5 217.0 210.0 190.0    Total Body Water (lbs) 156.5 159.0 153.5 139.0   24-hr recall: B (AM): NONE OR pc of bacon and 2 scrambled eggs (2 days/wk) - (0-20g) Snk (AM): [employer] granola bars (5g) L (PM): Sandwich ([place] & cheese on whole wheat) or dinner leftovers - (30-40g) Snk (PM): Cashews or almonds (1 cup) OR 1/2 avocado (10-20g) - increased FAT content!!! D (PM):  Pork chop or chicken (4-6 oz), 1/2 cup quinoa, salad (reg ranch) or green beans - (30-40g) Snk (PM): n/a  Fluid intake: Sweet tea, (occasional) 11 oz shake, 80 oz water: 100-110 oz Estimated total protein intake: 75-120 g  Medications: None Supplementation: Taking regularly  Using straws: No Drinking while eating: Yes; normal drinking, not baby sips Hair loss: No; Resolved. Carbonated beverages: Yes; 1-2 glasses of ginger ale a week - less carbonated from tap N/V/D/C: None Dumping syndrome: None reported  Recent physical activity: None reported at this time  Progress  Towards Goal(s):  In progress.  Nutritional Diagnosis:  Napoleon-3.3 Overweight/obesity As related to recent RYGB surgery.  As evidenced by patient following post-op nutrition guidelines for continued weight loss.    Intervention:  Nutrition education/reinforcement.  Monitoring/Evaluation:  Dietary intake, exercise, and body weight. Follow up in 3 months for 15 month post-op visit.

## 2012-06-09 NOTE — Patient Instructions (Addendum)
Goals:  Continue follow Phase 3B: High Protein + Non-Starchy Vegetables  Avoid drinking 15 minutes before, during and 30 minutes after eating  Aim for 30-60 min of exercise daily  Track intake using My Fitness Pal and make me a "friend" with rights to view your diary  Watch carb and fat portions.  Decrease portions of nuts and avocado   Decrease sweet tea

## 2012-06-10 ENCOUNTER — Telehealth (INDEPENDENT_AMBULATORY_CARE_PROVIDER_SITE_OTHER): Payer: Self-pay | Admitting: General Surgery

## 2012-06-10 ENCOUNTER — Ambulatory Visit
Admission: RE | Admit: 2012-06-10 | Discharge: 2012-06-10 | Disposition: A | Discharge status: Home / Self Care | Payer: BC Managed Care – PPO | Source: Ambulatory Visit | Attending: General Surgery | Admitting: General Surgery

## 2012-06-10 DIAGNOSIS — M7989 Other specified soft tissue disorders: Secondary | ICD-10-CM

## 2012-06-10 NOTE — Telephone Encounter (Signed)
Patient made aware Xray of foot looked okay. To let us know if he keeps having pains. He will call if needed.

## 2012-09-03 ENCOUNTER — Ambulatory Visit (INDEPENDENT_AMBULATORY_CARE_PROVIDER_SITE_OTHER): Payer: BC Managed Care – PPO | Admitting: General Surgery

## 2012-09-03 ENCOUNTER — Ambulatory Visit: Payer: BC Managed Care – PPO | Admitting: *Deleted

## 2013-01-15 ENCOUNTER — Other Ambulatory Visit: Payer: Self-pay

## 2013-10-01 IMAGING — CR DG FOOT COMPLETE 3+V*R*
4 series · 4 of 4 positions shown · non-contrast
Comparison: None.

CLINICAL DATA: Toe swelling after injury

RIGHT FOOT COMPLETE - 3+ VIEW

[t foot ap right]
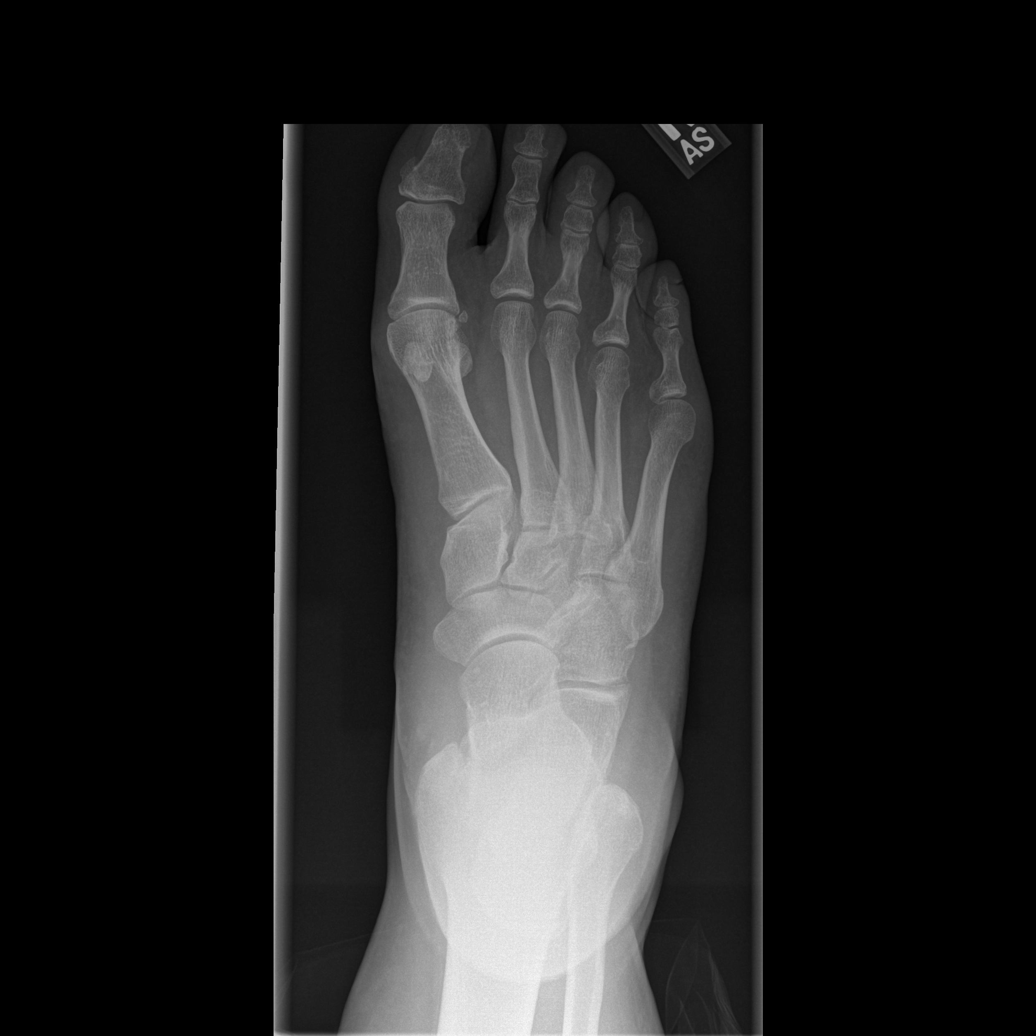

[t foot oblique right]
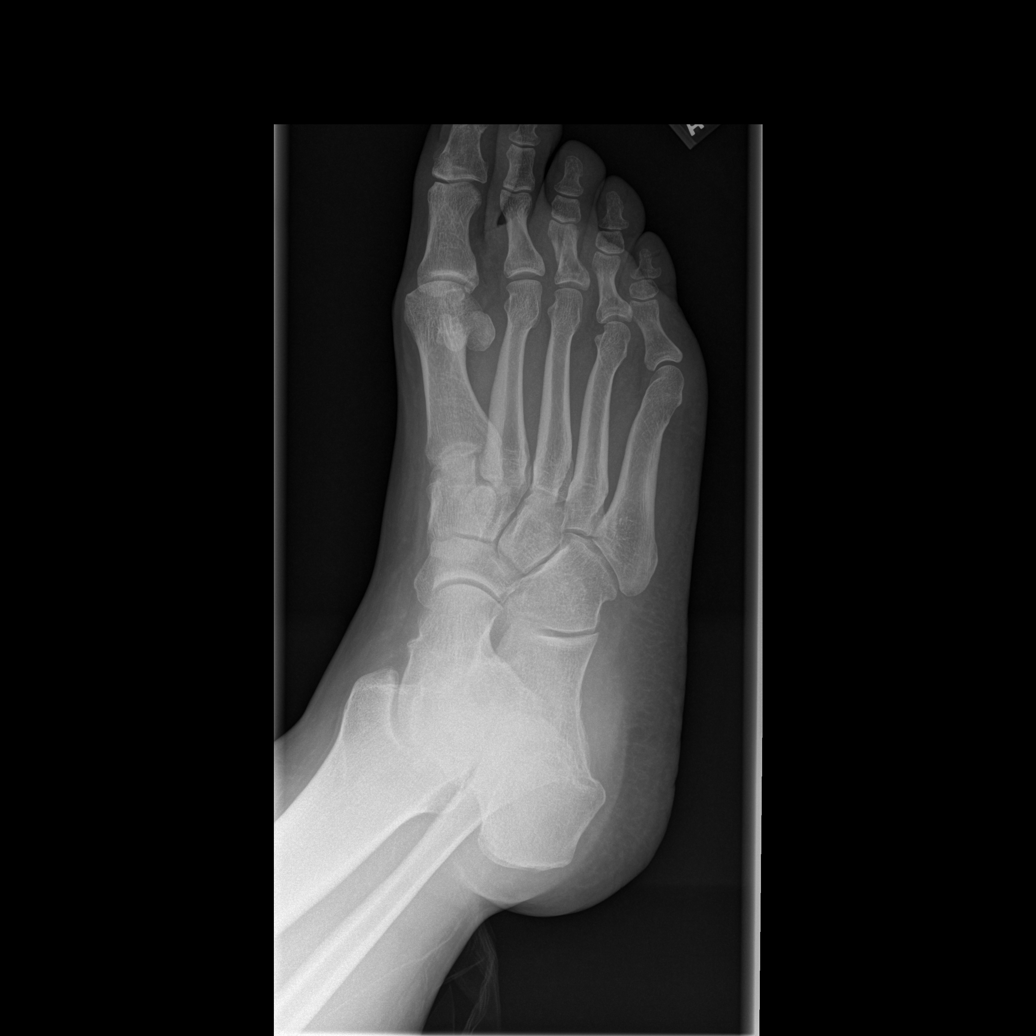

[t foot lat right (1 of 2)]
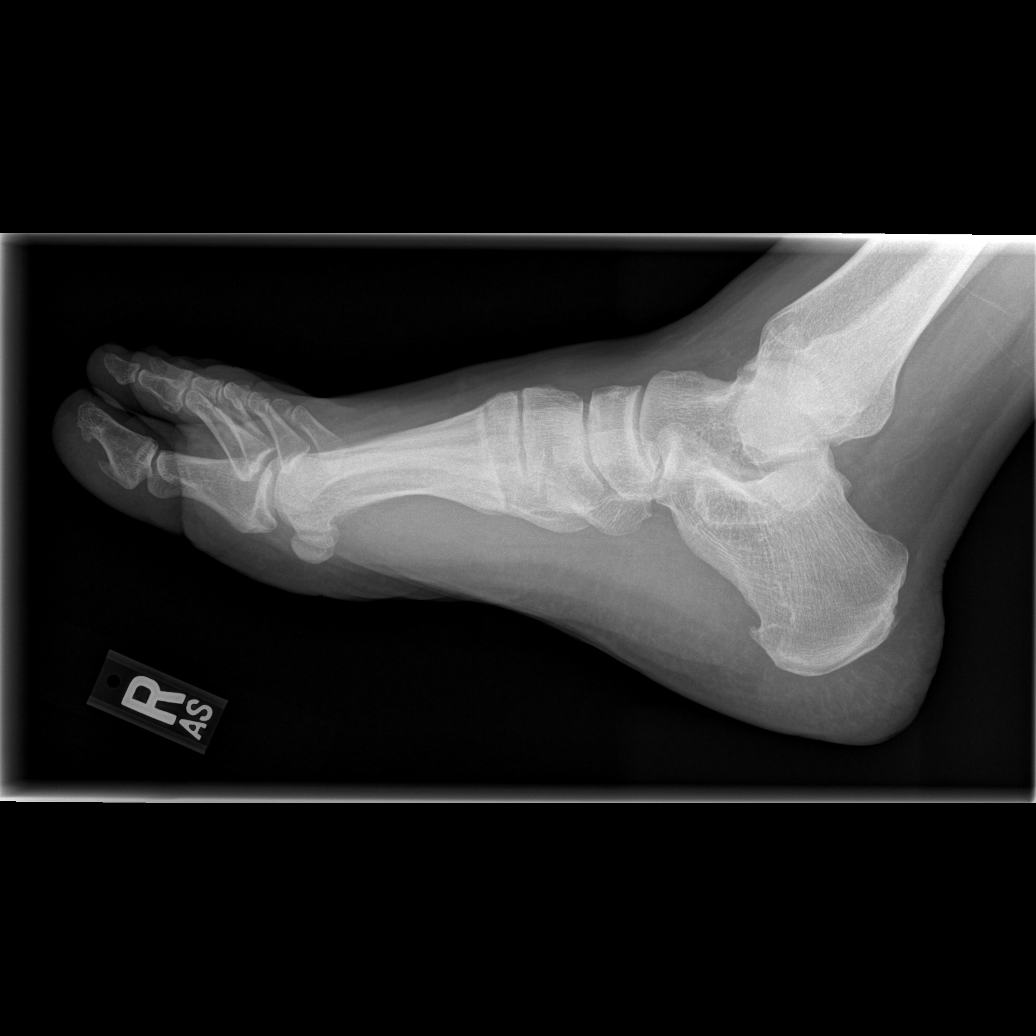

[t foot lat right (2 of 2)]
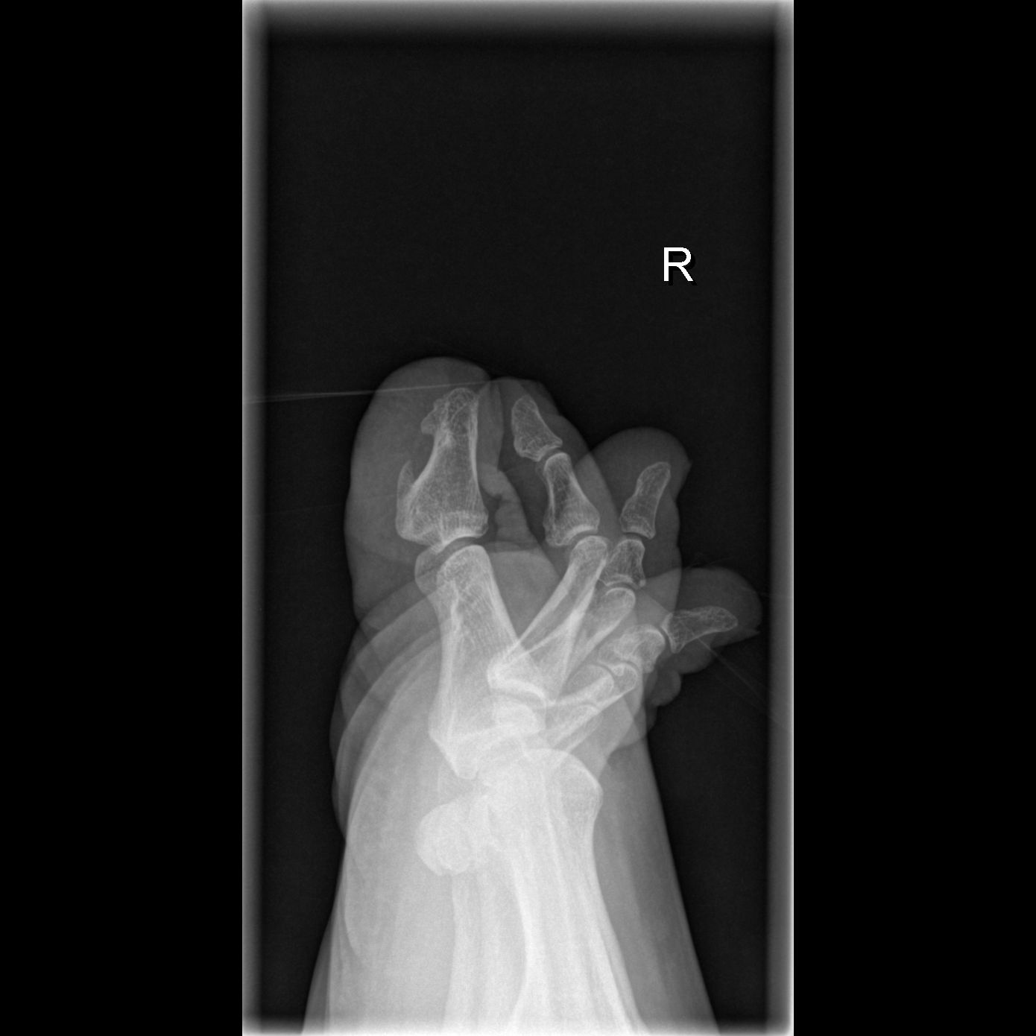

[4 of 4 positions shown; findings below may reference images not displayed]

FINDINGS: No fracture or dislocation is noted.  Joint spaces are
intact.  No radiopaque foreign body or other soft tissue
abnormality is noted.
IMPRESSION: Normal right foot.

## 2015-12-26 ENCOUNTER — Encounter (HOSPITAL_COMMUNITY): Payer: Self-pay

## 2016-12-25 ENCOUNTER — Encounter (HOSPITAL_COMMUNITY): Payer: Self-pay

## 2017-01-09 ENCOUNTER — Telehealth (HOSPITAL_COMMUNITY): Payer: Self-pay

## 2017-01-09 NOTE — Telephone Encounter (Signed)
This patient is overdue for recommended follow-up with a bariatric surgeon at [facility]. A letter was mailed to the address on file earlier this month from both [facility] in attempt to reestablish post-op care. Letter has been returned to [employer] marked undeliverable, unable to forward. No additional address on file in [facility] or [employer]. Information was shared with Mallory Shirk today at [facility] so she may contact the patient via phone again in attempt to get the patient scheduled for an appointment in their office.

## 2019-03-09 ENCOUNTER — Ambulatory Visit: Payer: HRSA Program | Attending: Internal Medicine

## 2019-03-09 DIAGNOSIS — Z20822 Contact with and (suspected) exposure to covid-19: Secondary | ICD-10-CM

## 2019-03-09 DIAGNOSIS — Z20828 Contact with and (suspected) exposure to other viral communicable diseases: Secondary | ICD-10-CM | POA: Diagnosis not present

## 2019-03-11 LAB — NOVEL CORONAVIRUS, NAA: SARS-CoV-2, NAA: NOT DETECTED

## 2019-07-09 DIAGNOSIS — L299 Pruritus, unspecified: Secondary | ICD-10-CM | POA: Diagnosis not present

## 2019-07-09 DIAGNOSIS — L255 Unspecified contact dermatitis due to plants, except food: Secondary | ICD-10-CM | POA: Diagnosis not present

## 2019-07-18 DIAGNOSIS — T7840XA Allergy, unspecified, initial encounter: Secondary | ICD-10-CM | POA: Diagnosis not present

## 2019-08-17 DIAGNOSIS — Z113 Encounter for screening for infections with a predominantly sexual mode of transmission: Secondary | ICD-10-CM | POA: Diagnosis not present

## 2019-10-07 DIAGNOSIS — S76312A Strain of muscle, fascia and tendon of the posterior muscle group at thigh level, left thigh, initial encounter: Secondary | ICD-10-CM | POA: Diagnosis not present

## 2020-07-27 DIAGNOSIS — E8881 Metabolic syndrome: Secondary | ICD-10-CM | POA: Diagnosis not present

## 2020-07-27 DIAGNOSIS — Z833 Family history of diabetes mellitus: Secondary | ICD-10-CM | POA: Diagnosis not present

## 2020-07-27 DIAGNOSIS — Z9884 Bariatric surgery status: Secondary | ICD-10-CM | POA: Diagnosis not present

## 2020-08-15 DIAGNOSIS — H60391 Other infective otitis externa, right ear: Secondary | ICD-10-CM | POA: Diagnosis not present

## 2020-08-27 DIAGNOSIS — J029 Acute pharyngitis, unspecified: Secondary | ICD-10-CM | POA: Diagnosis not present

## 2020-08-27 DIAGNOSIS — U071 COVID-19: Secondary | ICD-10-CM | POA: Diagnosis not present

## 2020-08-27 DIAGNOSIS — R509 Fever, unspecified: Secondary | ICD-10-CM | POA: Diagnosis not present

## 2020-09-29 DIAGNOSIS — H60391 Other infective otitis externa, right ear: Secondary | ICD-10-CM | POA: Diagnosis not present

## 2021-03-01 ENCOUNTER — Telehealth: Payer: Self-pay

## 2021-03-01 NOTE — Telephone Encounter (Signed)
Pt advised he is no longer a patient by phone call 03/01/21. [place]

## 2023-06-03 ENCOUNTER — Ambulatory Visit: Payer: Self-pay | Admitting: Podiatry

## 2023-06-05 ENCOUNTER — Encounter: Payer: Self-pay | Admitting: Podiatry

## 2023-06-05 ENCOUNTER — Ambulatory Visit: Payer: Self-pay | Admitting: Podiatry

## 2023-06-05 DIAGNOSIS — B351 Tinea unguium: Secondary | ICD-10-CM | POA: Diagnosis not present

## 2023-06-05 NOTE — Addendum Note (Signed)
 Addended by: Daryel November on: 06/05/2023 11:04 AM   Modules accepted: Orders

## 2023-06-05 NOTE — Progress Notes (Signed)
  Subjective:  Patient ID: Jeff Boyle, male    DOB: 01-26-1976,   MRN: [id number]  No chief complaint on file.   48 y.o. male presents for concern of right great toenail injury that occurred recent. He was holding his two year old and tripped and jammed his foot into the front of a step. Relates it has been loosened and painful when pressure applied. Relates has always had trouble with both great toenails being thickened.  . Denies any other pedal complaints. Denies n/v/f/c.   Past Medical History:  Diagnosis Date   Allergy    Headache(784.0)    Hypogonadism male    Morbid obesity (HCC)    OSA on CPAP    Psoriasis    Psoriasis    Sleep apnea    C PAP    Objective:  Physical Exam: Vascular: DP/PT pulses 2/4 bilateral. CFT <3 seconds. Normal hair growth on digits. No edema.  Skin. No lacerations or abrasions bilateral feet. Bilateral hallux nails are thickened and dystrophic with subungual debris. His right great toenail split with medial border loose and underlying nail bed healed.  Musculoskeletal: MMT 5/5 bilateral lower extremities in DF, PF, Inversion and Eversion. Deceased ROM in DF of ankle joint.  Neurological: Sensation intact to light touch.   Assessment:   1. Onychomycosis      Plan:  Patient was evaluated and treated and all questions answered. -Examined patient -Discussed treatment options for painful dystrophic nails  -Nail on right debrided back to well attached nail without incident and to patient comfort.  -Clinical picture and Fungal culture was obtained by removing a portion of the hard nail itself from each of the involved toenails using a sterile nail nipper and sent to [facility] lab. Patient tolerated the biopsy procedure well without discomfort or need for anesthesia.  -Discussed fungal nail treatment options including oral, topical, and laser treatments.  -Patient to return in 4 weeks for follow up evaluation and discussion of fungal culture results or  sooner if symptoms worsen.   Louann Sjogren, DPM

## 2023-06-24 ENCOUNTER — Other Ambulatory Visit: Payer: Self-pay | Admitting: Podiatry

## 2023-07-02 ENCOUNTER — Ambulatory Visit (INDEPENDENT_AMBULATORY_CARE_PROVIDER_SITE_OTHER): Admitting: Podiatry

## 2023-07-02 DIAGNOSIS — Z91199 Patient's noncompliance with other medical treatment and regimen due to unspecified reason: Secondary | ICD-10-CM

## 2023-07-02 NOTE — Progress Notes (Signed)
 No show

## 2023-07-03 ENCOUNTER — Other Ambulatory Visit: Payer: Self-pay | Admitting: Medical Genetics

## 2023-07-04 ENCOUNTER — Ambulatory Visit: Admitting: Orthopaedic Surgery

## 2023-07-04 ENCOUNTER — Other Ambulatory Visit (INDEPENDENT_AMBULATORY_CARE_PROVIDER_SITE_OTHER): Payer: Self-pay

## 2023-07-04 DIAGNOSIS — M25512 Pain in left shoulder: Secondary | ICD-10-CM | POA: Diagnosis not present

## 2023-07-04 DIAGNOSIS — G8929 Other chronic pain: Secondary | ICD-10-CM

## 2023-07-04 MED ORDER — DICLOFENAC SODIUM 75 MG PO TBEC: 75.0000 mg | DELAYED_RELEASE_TABLET | Freq: Two times a day (BID) | ORAL | 2 refills | Status: DC

## 2023-07-04 NOTE — Progress Notes (Signed)
 Office Visit Note   Patient: Jeff Boyle           Date of Birth: 1975/10/26           MRN: [id number] Visit Date: 07/04/2023              Requested by: No referring provider defined for this encounter. PCP: Pcp, No   Assessment & Plan: Visit Diagnoses:  1. Chronic left shoulder pain     Plan: Jeff Boyle is a 48 year old gentleman with chronic left shoulder pain.  This could be a manifestation of psoriatic arthritis.  Will obtain arthritis panel.  Prescription for Voltaren  gel to be taken for a week and then as needed.  Will notify patient of blood work results.  He should follow-up if symptoms do not improve in a couple weeks.  Could consider intra-articular injection or Dosepak.  Follow-Up Instructions: No follow-ups on file.   Orders:  Orders Placed This Encounter  Procedures   XR Shoulder Left   Uric acid   Sed Rate (ESR)   Rheumatoid Factor   C-reactive protein   Meds ordered this encounter  Medications   diclofenac  (VOLTAREN ) 75 MG EC tablet    Sig: Take 1 tablet (75 mg total) by mouth 2 (two) times daily.    Dispense:  14 tablet    Refill:  2      Procedures: No procedures performed   Clinical Data: No additional findings.   Subjective: Chief Complaint  Patient presents with   Left Shoulder - Pain    HPI Jeff Boyle is a 48 year old gentleman here for evaluation of left shoulder pain for 4 years.  States that it hurts when he is moving or lifting his arm.  Has gotten worse in the last 6 weeks.  He played football when he was younger but denies any specific injuries.  Right-hand-dominant.  Works in Museum/gallery exhibitions officer.  Pain is localized to the lateral deltoid.  He does have psoriasis. Review of Systems  Constitutional: Negative.   HENT: Negative.    Eyes: Negative.   Respiratory: Negative.    Cardiovascular: Negative.   Gastrointestinal: Negative.   Endocrine: Negative.   Genitourinary: Negative.   Skin: Negative.   Allergic/Immunologic: Negative.    Neurological: Negative.   Hematological: Negative.   Psychiatric/Behavioral: Negative.    All other systems reviewed and are negative.    Objective: Vital Signs: There were no vitals taken for this visit.  Physical Exam Vitals and nursing note reviewed.  Constitutional:      Appearance: He is well-developed.  HENT:     Head: Normocephalic and atraumatic.  Eyes:     Pupils: Pupils are equal, round, and reactive to light.  Pulmonary:     Effort: Pulmonary effort is normal.  Abdominal:     Palpations: Abdomen is soft.  Musculoskeletal:        General: Normal range of motion.     Cervical back: Neck supple.  Skin:    General: Skin is warm.  Neurological:     Mental Status: He is alert and oriented to person, place, and time.  Psychiatric:        Behavior: Behavior normal.        Thought Content: Thought content normal.        Judgment: Judgment normal.     Ortho Exam Exam of the left shoulder shows good range of motion and strength.  He has pain with all movements of the shoulder and manual muscle testing. Specialty Comments:  No specialty comments available.  Imaging: XR Shoulder Left Result Date: 07/04/2023 X-rays of the left shoulder showed no acute abnormalities.  Slight deformity to the anterior acromion.  Difficult to say if this is developmental or posttraumatic.    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   OSA on CPAP 05/28/2011   Metabolic syndrome 05/28/2011   Sinusitis 03/19/2011   Obesity, morbid (more than 100 lbs over ideal weight or BMI > 40) (HCC) 09/15/2010   Hypogonadism male 09/15/2010   Psoriasis 09/15/2010   Past Medical History:  Diagnosis Date   Allergy    Headache(784.0)    Hypogonadism male    Morbid obesity (HCC)    OSA on CPAP    Psoriasis    Psoriasis    Sleep apnea    C PAP    Family History  Problem Relation Age of Onset   Diabetes Mother     Cancer Maternal Grandmother        LUNG   Heart disease Maternal Grandfather     Past Surgical History:  Procedure Laterality Date   BREATH Sonjia Durie  04/06/2011   Procedure: BREATH TEK H PYLORI;  Surgeon: Fran Imus, MD;  Location: [facility];  Service: General;  Laterality: N/A;   GASTRIC ROUX-EN-Y  05/28/2011   Procedure: LAPAROSCOPIC ROUX-EN-Y GASTRIC;  Surgeon: Fran Imus, MD,FACS;  Location: [facility];  Service: General;  Laterality: N/A;  UPPER ENDOSCOPY   HERNIA REPAIR     as a child- [facility]   Social History   Occupational History   Not on file  Tobacco Use   Smoking status: Former    Current packs/day: 0.00    Types: Cigarettes    Quit date: 05/22/2006    Years since quitting: 17.1   Smokeless tobacco: Never  Substance and Sexual Activity   Alcohol use: No    Comment: 1-2 DRINKS PER WEEK   Drug use: No   Sexual activity: Not on file

## 2023-07-05 LAB — SEDIMENTATION RATE: Sed Rate: 9 mm/h (ref 0–15)

## 2023-07-05 LAB — RHEUMATOID FACTOR: Rheumatoid fact SerPl-aCnc: <10 [IU]/mL (ref <14)

## 2023-07-05 LAB — C-REACTIVE PROTEIN: CRP: 3.4 mg/L (ref <8.0)

## 2023-07-05 LAB — URIC ACID: Uric Acid, Serum: 5.1 mg/dL (ref 4.0–8.0)

## 2023-07-09 ENCOUNTER — Other Ambulatory Visit: Payer: Self-pay

## 2023-08-06 ENCOUNTER — Ambulatory Visit: Payer: Self-pay | Admitting: Internal Medicine

## 2023-08-13 ENCOUNTER — Ambulatory Visit (INDEPENDENT_AMBULATORY_CARE_PROVIDER_SITE_OTHER): Admitting: Podiatry

## 2023-08-13 ENCOUNTER — Encounter: Payer: Self-pay | Admitting: Podiatry

## 2023-08-13 DIAGNOSIS — B351 Tinea unguium: Secondary | ICD-10-CM

## 2023-08-13 MED ORDER — TERBINAFINE HCL 250 MG PO TABS: 250.0000 mg | ORAL_TABLET | Freq: Every day | ORAL | 2 refills | Status: AC

## 2023-08-13 NOTE — Progress Notes (Signed)
  Subjective:  Patient ID: Jeff Boyle, male    DOB: [birth year],   MRN: [id number]  Chief Complaint  Patient presents with   Nail Problem    Patient states taht everything has been going fine since last visit, patient states that he has not had any pain nor drainage since last visit.    48 y.o. male presents for follow-up of fungal nails and to discuss culture results.   . Denies any other pedal complaints. Denies n/v/f/c.   Past Medical History:  Diagnosis Date   Allergy    Headache(784.0)    Hypogonadism male    Morbid obesity (HCC)    OSA on CPAP    Psoriasis    Psoriasis    Sleep apnea    C PAP    Objective:  Physical Exam: Vascular: DP/PT pulses 2/4 bilateral. CFT <3 seconds. Normal hair growth on digits. No edema.  Skin. No lacerations or abrasions bilateral feet. Bilateral hallux nails are thickened and dystrophic with subungual debris. His right great toenail split with medial border loose and underlying nail bed healed.  Musculoskeletal: MMT 5/5 bilateral lower extremities in DF, PF, Inversion and Eversion. Deceased ROM in DF of ankle joint.  Neurological: Sensation intact to light touch.   Assessment:   1. Onychomycosis       Plan:  Patient was evaluated and treated and all questions answered. -Examined patient -Discussed treatment options for painful dystrophic nails  -Nail on right debrided back to well attached nail without incident and to patient comfort.  -Culture positive for dermatophyte -Discussed fungal nail treatment options including oral, topical, and laser treatments.  -Would like to try lamisil. 90 days sent to pharmacy.  -Previous LFTS wnl.  -Patient to return in 3 months for fungal nail check.    Jennefer Moats, DPM

## 2023-09-03 ENCOUNTER — Ambulatory Visit: Payer: Self-pay | Admitting: Family Medicine

## 2023-09-03 ENCOUNTER — Encounter: Payer: Self-pay | Admitting: Family Medicine

## 2023-09-03 ENCOUNTER — Ambulatory Visit: Admitting: Family Medicine

## 2023-09-03 VITALS — BP 136/84 | HR 80 | Temp 98.1°F | Ht 73.0 in | Wt >= 6400 oz

## 2023-09-03 DIAGNOSIS — R5383 Other fatigue: Secondary | ICD-10-CM | POA: Diagnosis not present

## 2023-09-03 DIAGNOSIS — Z1211 Encounter for screening for malignant neoplasm of colon: Secondary | ICD-10-CM

## 2023-09-03 DIAGNOSIS — R7989 Other specified abnormal findings of blood chemistry: Secondary | ICD-10-CM

## 2023-09-03 DIAGNOSIS — Z1159 Encounter for screening for other viral diseases: Secondary | ICD-10-CM

## 2023-09-03 DIAGNOSIS — Z114 Encounter for screening for human immunodeficiency virus [HIV]: Secondary | ICD-10-CM

## 2023-09-03 DIAGNOSIS — E291 Testicular hypofunction: Secondary | ICD-10-CM

## 2023-09-03 DIAGNOSIS — G4730 Sleep apnea, unspecified: Secondary | ICD-10-CM

## 2023-09-03 DIAGNOSIS — E559 Vitamin D deficiency, unspecified: Secondary | ICD-10-CM

## 2023-09-03 LAB — LIPID PANEL
Cholesterol: 148 mg/dL (ref 0–200)
HDL: 40.1 mg/dL (ref >39.00)
LDL Cholesterol: 86 mg/dL (ref 0–99)
NonHDL: 107.4
Total CHOL/HDL Ratio: 4
Triglycerides: 106 mg/dL (ref 0.0–149.0)
VLDL: 21.2 mg/dL (ref 0.0–40.0)

## 2023-09-03 LAB — COMPREHENSIVE METABOLIC PANEL WITH GFR
ALT: 25 U/L (ref 0–53)
AST: 22 U/L (ref 0–37)
Albumin: 4.5 g/dL (ref 3.5–5.2)
Alkaline Phosphatase: 63 U/L (ref 39–117)
BUN: 14 mg/dL (ref 6–23)
CO2: 30 meq/L (ref 19–32)
Calcium: 9.4 mg/dL (ref 8.4–10.5)
Chloride: 103 meq/L (ref 96–112)
Creatinine, Ser: 0.81 mg/dL (ref 0.40–1.50)
GFR: 106.11 mL/min (ref >60.00)
Glucose, Bld: 95 mg/dL (ref 70–99)
Potassium: 4.3 meq/L (ref 3.5–5.1)
Sodium: 139 meq/L (ref 135–145)
Total Bilirubin: 0.4 mg/dL (ref 0.2–1.2)
Total Protein: 7.2 g/dL (ref 6.0–8.3)

## 2023-09-03 LAB — CBC WITH DIFFERENTIAL/PLATELET
Basophils Absolute: 0 10*3/uL (ref 0.0–0.1)
Basophils Relative: 0.6 % (ref 0.0–3.0)
Eosinophils Absolute: 0.1 10*3/uL (ref 0.0–0.7)
Eosinophils Relative: 2.4 % (ref 0.0–5.0)
HCT: 41.4 % (ref 39.0–52.0)
Hemoglobin: 13.9 g/dL (ref 13.0–17.0)
Lymphocytes Relative: 21.2 % (ref 12.0–46.0)
Lymphs Abs: 1.2 10*3/uL (ref 0.7–4.0)
MCHC: 33.6 g/dL (ref 30.0–36.0)
MCV: 79.2 fl (ref 78.0–100.0)
Monocytes Absolute: 0.7 10*3/uL (ref 0.1–1.0)
Monocytes Relative: 12.7 % — ABNORMAL HIGH (ref 3.0–12.0)
Neutro Abs: 3.7 10*3/uL (ref 1.4–7.7)
Neutrophils Relative %: 63.1 % (ref 43.0–77.0)
Platelets: 242 10*3/uL (ref 150.0–400.0)
RBC: 5.23 Mil/uL (ref 4.22–5.81)
RDW: 14.7 % (ref 11.5–15.5)
WBC: 5.8 10*3/uL (ref 4.0–10.5)

## 2023-09-03 LAB — HEMOGLOBIN A1C: Hgb A1c MFr Bld: 5.6 % (ref 4.6–6.5)

## 2023-09-03 LAB — TSH: TSH: 2.2 u[IU]/mL (ref 0.35–5.50)

## 2023-09-03 LAB — VITAMIN B12: Vitamin B-12: >1500 pg/mL — ABNORMAL HIGH (ref 211–911)

## 2023-09-03 LAB — VITAMIN D 25 HYDROXY (VIT D DEFICIENCY, FRACTURES): VITD: 15.97 ng/mL — ABNORMAL LOW (ref 30.00–100.00)

## 2023-09-03 LAB — TESTOSTERONE: Testosterone: 50.43 ng/dL — ABNORMAL LOW (ref 300.00–890.00)

## 2023-09-03 MED ORDER — VITAMIN D3 1.25 MG (50000 UT) PO CAPS: 1.2500 mg | ORAL_CAPSULE | ORAL | 0 refills | Status: AC

## 2023-09-03 NOTE — Patient Instructions (Addendum)
-  It was nice to meet you and look forward to taking care of you.  -Ordered labs. Office will call with results and will be available on MyChart.  -Ordered cologuard for colon/rectal cancer.  -Follow up based on labs.

## 2023-09-03 NOTE — Addendum Note (Signed)
 Addended by: ELNER NANNY B on: 09/03/2023 03:39 PM   Modules accepted: Orders

## 2023-09-03 NOTE — Progress Notes (Signed)
 New Patient Office Visit  Subjective   Patient ID: Jeff Boyle, male    DOB: [birth year]  Age: 48 y.o. MRN: [id number]  CC:  Chief Complaint  Patient presents with   Establish Care    HPI [name] presents to establish care with new provider.  Patients previous primary care provider: [facility] with Redge Budge, NP. Last seen on 09/2020.   Specialist: [facility] with Dr. Asberry Failing Previously seen [facility] with Dr. Kay Cummins   Patient complains of being tired, increased severity in the last 6 months. Denies any major change except last summer started new job where he is traveling 2 days a week and having increased stress. Has a history of sleep apnea, but does not wear C-pap. He reports he stopped wearing a CPAP at least 10 years ago when he lost a significant amount of weight.   Outpatient Encounter Medications as of 09/03/2023  Medication Sig   Semaglutide-Weight Management 0.25 MG/0.5ML SOAJ Inject 0.25 mg into the skin.   terbinafine  (LAMISIL ) 250 MG tablet Take 1 tablet (250 mg total) by mouth daily.   [DISCONTINUED] Calcium Citrate-Vitamin D  (CALCIUM CITRATE +D PO) Take 500 mg by mouth 3 (three) times daily.   [DISCONTINUED] Cholecalciferol (VITAMIN D3) 5000 UNITS TABS Take 50,000 Units by mouth once a week.   [DISCONTINUED] Cyanocobalamin  (B-12 SL) Place 500 mcg under the tongue daily.   [DISCONTINUED] diclofenac  (VOLTAREN ) 75 MG EC tablet Take 1 tablet (75 mg total) by mouth 2 (two) times daily.   [DISCONTINUED] Multiple Vitamin (MULTIVITAMIN) capsule Take 2 capsules by mouth daily.   No facility-administered encounter medications on file as of 09/03/2023.    Past Medical History:  Diagnosis Date   Allergy    Headache(784.0)    Hypogonadism male    Morbid obesity (HCC)    OSA on CPAP    Psoriasis    Psoriasis    Sleep apnea    C PAP    Past Surgical  History:  Procedure Laterality Date   BREATH TEK H PYLORI  04/06/2011   Procedure: BREATH TEK H PYLORI;  Surgeon: Camellia CHRISTELLA Blush, MD;  Location: [facility];  Service: General;  Laterality: N/A;   GASTRIC ROUX-EN-Y  05/28/2011   Procedure: LAPAROSCOPIC ROUX-EN-Y GASTRIC;  Surgeon: Camellia CHRISTELLA Blush, MD,FACS;  Location: [facility];  Service: General;  Laterality: N/A;  UPPER ENDOSCOPY   HERNIA REPAIR     as a child- [facility]    Family History  Problem Relation Age of Onset   Arthritis Mother    Hypertension Mother    Hyperlipidemia Mother    Diabetes Mother    Miscarriages / [place] Mother    Arthritis Father    Hyperlipidemia Father    Hypertension Father    Pancreatitis Father    Hypertension Brother    Hyperlipidemia Brother    Cancer Maternal Grandmother        LUNG   Heart disease Maternal Grandfather     Social History   Socioeconomic History   Marital status: Married    Spouse name: Not on file   Number of children: 1   Years of education: Not on file   Highest education level: Bachelor's degree (e.g., BA, AB, BS)  Occupational History   Not on file  Tobacco Use   Smoking status: Former    Current packs/day: 0.00    Types: Cigarettes    Quit date:  05/22/2006    Years since quitting: 17.2   Smokeless tobacco: Never  Vaping Use   Vaping status: Never Used  Substance and Sexual Activity   Alcohol use: Yes    Comment: 1-2 DRINKS PER WEEK   Drug use: No   Sexual activity: Yes  Other Topics Concern   Not on file  Social History Narrative   Not on file   Social Drivers of Health   Financial Resource Strain: Low Risk  (08/05/2023)   Overall Financial Resource Strain (CARDIA)    Difficulty of Paying Living Expenses: Not very hard  Food Insecurity: No Food Insecurity (08/05/2023)   Hunger Vital Sign    Worried About Running Out of Food in the Last Year: Never true    Ran Out of Food in the Last Year: Never true  Transportation Needs: No Transportation Needs (08/05/2023)    PRAPARE - Administrator, Civil Service (Medical): No    Lack of Transportation (Non-Medical): No  Physical Activity: Insufficiently Active (08/05/2023)   Exercise Vital Sign    Days of Exercise per Week: 1 day    Minutes of Exercise per Session: 40 min  Stress: Stress Concern Present (08/05/2023)   [employer] of Occupational Health - Occupational Stress Questionnaire    Feeling of Stress : To some extent  Social Connections: Moderately Integrated (08/05/2023)   Social Connection and Isolation Panel    Frequency of Communication with Friends and Family: More than three times a week    Frequency of Social Gatherings with Friends and Family: Once a week    Attends Religious Services: 1 to 4 times per year    Active Member of [employer] or Organizations: No    Attends Banker Meetings: Not on file    Marital Status: Married  Catering manager Violence: Not At Risk (09/03/2023)   Humiliation, Afraid, Rape, and Kick questionnaire    Fear of Current or Ex-Partner: No    Emotionally Abused: No    Physically Abused: No    Sexually Abused: No    ROS See HPI above    Objective  BP 136/84   Pulse 80   Temp 98.1 F (36.7 C) (Oral)   Ht 6' 1 (1.854 m)   Wt (!) 421 lb (191 kg)   SpO2 96%   BMI 55.54 kg/m   Physical Exam Vitals reviewed.  Constitutional:      General: He is not in acute distress.    Appearance: Normal appearance. He is obese. He is not ill-appearing, toxic-appearing or diaphoretic.  HENT:     Head: Normocephalic and atraumatic.   Eyes:     General:        Right eye: No discharge.        Left eye: No discharge.     Conjunctiva/sclera: Conjunctivae normal.    Cardiovascular:     Rate and Rhythm: Normal rate and regular rhythm.     Heart sounds: Normal heart sounds. No murmur heard.    No friction rub. No gallop.  Pulmonary:     Effort: Pulmonary effort is normal. No respiratory distress.     Breath sounds: Normal breath sounds.    Musculoskeletal:        General: Normal range of motion.   Skin:    General: Skin is warm and dry.   Neurological:     General: No focal deficit present.     Mental Status: He is alert and oriented to person, place, and  time. Mental status is at baseline.   Psychiatric:        Mood and Affect: Mood normal.        Behavior: Behavior normal.        Thought Content: Thought content normal.        Judgment: Judgment normal.      Assessment & Plan:  Colon cancer screening -     Cologuard  Fatigue, unspecified type -     CBC with Differential/Platelet -     Comprehensive metabolic panel with GFR -     Hemoglobin A1c -     Testosterone -     TSH -     Vitamin B12 -     VITAMIN D  25 Hydroxy (Vit-D Deficiency, Fractures)  Hypogonadism male -     Testosterone  Obesity, morbid (more than 100 lbs over ideal weight or BMI > 40) (HCC) -     CBC with Differential/Platelet -     Comprehensive metabolic panel with GFR -     Hemoglobin A1c -     Lipid panel -     TSH  Need for hepatitis C screening test -     Hepatitis C antibody  Encounter for screening for HIV -     HIV Antibody (routine testing w rflx)   1.Review health maintenance:  -Cologuard: Placed Order  -Covid booster: UTD, last fall  -Tdap vaccine: Declines  -Hep B vaccines: Unsure, look at immunization portal  -HIV and Hep C screening: Order  2.Ordered labs for complaints of fatigue, BMI-obesity, hypogonadism, and screening.  3. Discussed if labs are all normal and do not show a reason for fatigue, he needs a referral back to pulmonary for a sleep study with Cpap machine.  4.Will follow up based on lab results.   Darline Faith, NP

## 2023-09-03 NOTE — Addendum Note (Signed)
 Addended by: ELNER NANNY B on: 09/03/2023 02:23 PM   Modules accepted: Orders

## 2023-09-04 LAB — HEPATITIS C ANTIBODY: Hepatitis C Ab: NONREACTIVE

## 2023-09-04 LAB — HIV ANTIBODY (ROUTINE TESTING W REFLEX): HIV 1&2 Ab, 4th Generation: NONREACTIVE

## 2023-09-11 ENCOUNTER — Other Ambulatory Visit (INDEPENDENT_AMBULATORY_CARE_PROVIDER_SITE_OTHER)

## 2023-09-11 DIAGNOSIS — E291 Testicular hypofunction: Secondary | ICD-10-CM

## 2023-09-11 DIAGNOSIS — E559 Vitamin D deficiency, unspecified: Secondary | ICD-10-CM

## 2023-09-11 DIAGNOSIS — R7989 Other specified abnormal findings of blood chemistry: Secondary | ICD-10-CM | POA: Diagnosis not present

## 2023-09-11 LAB — VITAMIN D 25 HYDROXY (VIT D DEFICIENCY, FRACTURES): VITD: 14.32 ng/mL — ABNORMAL LOW (ref 30.00–100.00)

## 2023-09-11 LAB — TESTOSTERONE: Testosterone: 55.38 ng/dL — ABNORMAL LOW (ref 300.00–890.00)

## 2023-09-11 LAB — PSA: PSA: 0.05 ng/mL — ABNORMAL LOW (ref 0.10–4.00)

## 2023-09-12 NOTE — Addendum Note (Signed)
 Addended by: ELNER NANNY B on: 09/12/2023 11:06 AM   Modules accepted: Orders

## 2023-10-24 ENCOUNTER — Encounter (HOSPITAL_BASED_OUTPATIENT_CLINIC_OR_DEPARTMENT_OTHER): Payer: Self-pay | Admitting: Adult Health

## 2023-10-24 ENCOUNTER — Ambulatory Visit (HOSPITAL_BASED_OUTPATIENT_CLINIC_OR_DEPARTMENT_OTHER): Admitting: Adult Health

## 2023-10-24 VITALS — BP 119/78 | HR 78 | Ht 73.0 in | Wt >= 6400 oz

## 2023-10-24 DIAGNOSIS — G4730 Sleep apnea, unspecified: Secondary | ICD-10-CM | POA: Diagnosis not present

## 2023-10-24 DIAGNOSIS — Z6841 Body Mass Index (BMI) 40.0 and over, adult: Secondary | ICD-10-CM

## 2023-10-24 DIAGNOSIS — R0683 Snoring: Secondary | ICD-10-CM

## 2023-10-24 DIAGNOSIS — Z9884 Bariatric surgery status: Secondary | ICD-10-CM

## 2023-10-24 DIAGNOSIS — Z87891 Personal history of nicotine dependence: Secondary | ICD-10-CM

## 2023-10-24 NOTE — Patient Instructions (Signed)
 Set up for home sleep study  Work on healthy weight loss  Do not drive if sleepy  Follow up in 6 weeks to discuss results and treatment plan -Virtual visit

## 2023-10-24 NOTE — Progress Notes (Signed)
 @Patient  ID: Jeff Boyle, male    DOB: 1975-12-01, 48 y.o.   MRN: [id number]  Chief Complaint  Patient presents with   Establish Care    New Sleep    Referring provider: Billy Philippe SAUNDERS, NP  HPI: 48 year old male seen for sleep consult October 24, 2018 for snoring, restless sleep and daytime sleepiness.  TEST/EVENTS :   10/24/2023 Sleep consult  Discussed the use of AI scribe software for clinical note transcription with the patient, who gave verbal consent to proceed.  History of Present Illness Jeff Boyle is a 48 year old male with sleep apnea who presents with persistent fatigue and sleep disturbances. He was referred by his primary care physician for evaluation of possible sleep apnea.  He experiences persistent fatigue and feels tired all the time. He has a history of sleep apnea diagnosed in 2009, for which he used a CPAP machine for about three years. After undergoing gastric bypass surgery in 2011 and losing approximately 100 pounds, he discontinued the CPAP machine. However, he has since regained some weight.  Current weight is 406 pounds with a BMI at 53.  His sleep is restless, waking up four to six times a night, and he does not feel refreshed upon waking. He denies napping during the day. He consumes about two cups of caffeine daily, including a cup of coffee in the morning and a Diet Coke in the afternoon. He occasionally takes Benadryl to help stay asleep but has no issues falling asleep, often doing so within five minutes of lying down.  Typically goes to bed about 10 PM.  Gets up at 5 AM.  He has a family history of sleep apnea, with his brother using a CPAP machine. No history of congestive heart failure or stroke. He underwent a sleep study in 2009, which confirmed sleep apnea, but has not had a recent study. He wears a watch that monitors his oxygen levels during sleep, which typically reads 91-92%.  His social history includes no current  smoking, occasional social alcohol consumption, and a history of gastric bypass surgery in 2011. He is currently onOzempic, which he started one to two months ago, and reports a weight loss of about ten pounds since starting the medication. He denies any side effects from the medication.  No symptoms of sleep paralysis, cataplexy, or other rare sleep conditions. He also reports no major issues following his gastric bypass surgery, aside from a recent need to supplement with vitamin D due to low levels.     10/24/2023    9:00 AM  Results of the Epworth flowsheet  Sitting and reading 1  Watching TV 1  Sitting, inactive in a public place (e.g. a theatre or a meeting) 1  As a passenger in a car for an hour without a break 2  Lying down to rest in the afternoon when circumstances permit 3  Sitting and talking to someone 0  Sitting quietly after a lunch without alcohol 1  In a car, while stopped for a few minutes in traffic 0  Total score 9      SH: Patient is married lives at home with his wife and 38-year-old son.  Works in Museum/gallery exhibitions officer..  Former smoker.  Social alcohol.  No drug use.  Family history positive for allergies and sleep apnea  Past Surgical History:  Procedure Laterality Date   BREATH SHIRLEAN VEAR LORA  04/06/2011   Procedure: BREATH TEK VEAR LORA;  Surgeon: Camellia CHRISTELLA Blush, MD;  Location: [facility];  Service: General;  Laterality: N/A;   GASTRIC ROUX-EN-Y  05/28/2011   Procedure: LAPAROSCOPIC ROUX-EN-Y GASTRIC;  Surgeon: Camellia CHRISTELLA Blush, MD,FACS;  Location: [facility];  Service: General;  Laterality: N/A;  UPPER ENDOSCOPY   HERNIA REPAIR     as a child- [facility]   .    Allergies  Allergen Reactions   Prednisone Itching, Dermatitis and Hives    Immunization History  Administered Date(s) Administered   PFIZER(Purple Top)SARS-COV-2 Vaccination 05/26/2019, 06/16/2019, 01/21/2020   Tdap 07/11/2011    Past Medical History:  Diagnosis Date   Allergy    Headache(784.0)     Hypogonadism male    Morbid obesity (HCC)    OSA on CPAP    Psoriasis    Psoriasis    Sleep apnea    C PAP    Tobacco History: Social History   Tobacco Use  Smoking Status Former   Current packs/day: 0.00   Types: Cigarettes   Quit date: 05/22/2006   Years since quitting: 17.4  Smokeless Tobacco Never   Counseling given: Not Answered   Outpatient Medications Prior to Visit  Medication Sig Dispense Refill   Cholecalciferol (VITAMIN D3) 1.25 MG (50000 UT) CAPS Take 1 capsule (1.25 mg total) by mouth every 7 (seven) days. 12 capsule 0   Semaglutide-Weight Management 0.25 MG/0.5ML SOAJ Inject 0.25 mg into the skin.     terbinafine (LAMISIL) 250 MG tablet Take 1 tablet (250 mg total) by mouth daily. 30 tablet 2   No facility-administered medications prior to visit.     Review of Systems:   Constitutional:   No  weight loss, night sweats,  Fevers, chills, +fatigue, or  lassitude.  HEENT:   No headaches,  Difficulty swallowing,  Tooth/dental problems, or  Sore throat,                No sneezing, itching, ear ache, nasal congestion, post nasal drip,   CV:  No chest pain,  Orthopnea, PND, swelling in lower extremities, anasarca, dizziness, palpitations, syncope.   GI  No heartburn, indigestion, abdominal pain, nausea, vomiting, diarrhea, change in bowel habits, loss of appetite, bloody stools.   Resp: No shortness of breath with exertion or at rest.  No excess mucus, no productive cough,  No non-productive cough,  No coughing up of blood.  No change in color of mucus.  No wheezing.  No chest wall deformity  Skin: no rash or lesions.  GU: no dysuria, change in color of urine, no urgency or frequency.  No flank pain, no hematuria   MS:  No joint pain or swelling.  No decreased range of motion.  No back pain.    Physical Exam  BP 119/78 (BP Location: Left Arm, Patient Position: Sitting, Cuff Size: Large)   Pulse 78   Ht 6' 1 (1.854 m)   Wt (!) 406 lb (184.2 kg)   SpO2  95%   BMI 53.57 kg/m   GEN: A/Ox3; pleasant , NAD, well nourished    HEENT:  [place]/AT,  NOSE-clear, THROAT-clear, no lesions, no postnasal drip or exudate noted. Class 3 MP airway   NECK:  Supple w/ fair ROM; no JVD; normal carotid impulses w/o bruits; no thyromegaly or nodules palpated; no lymphadenopathy.    RESP  Clear  P & A; w/o, wheezes/ rales/ or rhonchi. no accessory muscle use, no dullness to percussion  CARD:  RRR, no m/r/g, no peripheral edema, pulses intact, no cyanosis or clubbing.  GI:  Soft & nt; nml bowel sounds; no organomegaly or masses detected.   Musco: Warm bil, no deformities or joint swelling noted.   Neuro: alert, no focal deficits noted.    Skin: Warm, no lesions or rashes    Lab Results:  CBC    Component Value Date/Time   WBC 5.8 09/03/2023 0856   RBC 5.23 09/03/2023 0856   HGB 13.9 09/03/2023 0856   HCT 41.4 09/03/2023 0856   PLT 242.0 09/03/2023 0856   MCV 79.2 09/03/2023 0856   MCH 29.1 01/10/2012 0959   MCHC 33.6 09/03/2023 0856   RDW 14.7 09/03/2023 0856   LYMPHSABS 1.2 09/03/2023 0856   MONOABS 0.7 09/03/2023 0856   EOSABS 0.1 09/03/2023 0856   BASOSABS 0.0 09/03/2023 0856    BMET    Component Value Date/Time   NA 139 09/03/2023 0856   K 4.3 09/03/2023 0856   CL 103 09/03/2023 0856   CO2 30 09/03/2023 0856   GLUCOSE 95 09/03/2023 0856   BUN 14 09/03/2023 0856   CREATININE 0.81 09/03/2023 0856   CREATININE 0.74 01/10/2012 0959   CALCIUM 9.4 09/03/2023 0856   GFRNONAA >90 05/22/2011 1215   GFRAA >90 05/22/2011 1215    BNP No results found for: BNP  ProBNP No results found for: PROBNP  Imaging: No results found.  Administration History     None           No data to display          No results found for: NITRICOXIDE      Assessment & Plan:   No problem-specific Assessment & Plan notes found for this encounter.  Assessment and Plan Assessment & Plan Suspected obstructive sleep apnea   He  experiences snoring, restless sleep, frequent awakenings, and daytime fatigue. Sleep apnea diagnosed in 2009 improved after significant weight loss post-gastric bypass surgery in 2011. Some weight has been regained, possibly contributing to recurrence.  Current BMI is 53.  Oxygen saturation levels measured by a watch are around 91-92%, which is not alarming but not definitive. While other sleep disorders are considered, obstructive sleep apnea is the primary suspicion. Order a home sleep study through [facility]. Schedule a follow-up visit in six weeks to review sleep study results. Discuss potential treatment options based on the severity of sleep apnea, including CPAP, weight loss, and possibly Zepbound if indicated.  - discussed how weight can impact sleep and risk for sleep disordered breathing - discussed options to assist with weight loss: combination of diet modification, cardiovascular and strength training exercises   - had an extensive discussion regarding the adverse health consequences related to untreated sleep disordered breathing - specifically discussed the risks for hypertension, coronary artery disease, cardiac dysrhythmias, cerebrovascular disease, and diabetes - lifestyle modification discussed   - discussed how sleep disruption can increase risk of accidents, particularly when driving - safe driving practices were discussed     Morbid Obesity with previous  gastric bypass surgery   He underwent gastric bypass surgery in 2011, resulting in a 100-pound weight loss, but some weight has been regained, potentially contributing to the recurrence of sleep apnea. Currently on Ozempic, a GLP-1 receptor agonist, for weight management, with a 10-pound loss in the last two months. There is potential for using Zepbound, as part of treatment for sleep apnea if indicated by sleep study results. Zepbound studies show promising results for sleep apnea treatment. Continue the current Ozempic  regimen for weight management. Evaluate candidacy for Zepbound based on  sleep study results and insurance coverage. Monitor weight and encourage continued weight loss efforts.    I spent  45  minutes dedicated to the care of this patient on the date of this encounter to include pre-visit review of records, face-to-face time with the patient discussing conditions above, post visit ordering of testing, clinical documentation with the electronic health record, making appropriate referrals as documented, and communicating necessary findings to members of the patients care team.   Madelin Stank, NP 10/24/2023

## 2023-10-24 NOTE — Progress Notes (Signed)
 Epworth Sleepiness Scale  Use the following scale to choose the most appropriate number for each situation. 0 Would never nod off 1  Slight  chance of nodding off 2 Moderate chance of nodding off 3 High chance of nodding off  Sitting and reading: 1 Watching TV: 1 Sitting, inactive, in a public place (e.g., in a meeting, theater, or dinner event): 1 As a passenger in a car for an hour or more without stopping for a break: 2 Lying down to rest when circumstances permit:3 Sitting and talking to someone: 0 Sitting quietly after a meal without alcohol: 1 In a car, while stopped for a few  minutes in traffic or at a light: 0  TOTOAL: 9

## 2023-11-13 ENCOUNTER — Other Ambulatory Visit: Payer: Self-pay | Admitting: Urology

## 2023-11-13 DIAGNOSIS — E230 Hypopituitarism: Secondary | ICD-10-CM

## 2023-11-18 ENCOUNTER — Encounter

## 2023-11-18 DIAGNOSIS — R0683 Snoring: Secondary | ICD-10-CM

## 2023-11-19 ENCOUNTER — Encounter: Payer: Self-pay | Admitting: Family Medicine

## 2023-11-19 ENCOUNTER — Ambulatory Visit: Admitting: Podiatry

## 2023-11-20 ENCOUNTER — Encounter: Payer: Self-pay | Admitting: Urology

## 2023-11-27 ENCOUNTER — Encounter: Payer: Self-pay | Admitting: Gastroenterology

## 2023-11-28 ENCOUNTER — Telehealth: Payer: Self-pay

## 2023-11-28 ENCOUNTER — Other Ambulatory Visit

## 2023-11-28 NOTE — Telephone Encounter (Signed)
 APP clinic visit to discuss CRC screening options.  - H. Legrand, MD

## 2023-11-28 NOTE — Telephone Encounter (Signed)
 Dr. Legrand, In preparation for this patient's PV, it was noted his BMI is greater than 50. Please advise if this patient needs to be rescheduled for an OV or can he be a direct at the hospital. Please/thank you Bre, PV RN

## 2023-11-30 ENCOUNTER — Telehealth: Admitting: Nurse Practitioner

## 2023-11-30 DIAGNOSIS — H109 Unspecified conjunctivitis: Secondary | ICD-10-CM

## 2023-11-30 MED ORDER — POLYMYXIN B-TRIMETHOPRIM 10000-0.1 UNIT/ML-% OP SOLN: 1.0000 [drp] | OPHTHALMIC | 0 refills | Status: AC

## 2023-11-30 NOTE — Progress Notes (Signed)

## 2023-11-30 NOTE — Progress Notes (Signed)
 I have spent 5 minutes in review of e-visit questionnaire, review and updating patient chart, medical decision making and response to patient.   Claiborne Rigg, NP

## 2023-12-02 DIAGNOSIS — G4733 Obstructive sleep apnea (adult) (pediatric): Secondary | ICD-10-CM | POA: Diagnosis not present

## 2023-12-03 NOTE — Telephone Encounter (Signed)
 Attempted to reach patient- unable to speak with patient- left message for the patient to call back to the office; will attempt to reach patient at a later date/time;

## 2023-12-05 ENCOUNTER — Telehealth: Payer: Self-pay | Admitting: *Deleted

## 2023-12-05 NOTE — Telephone Encounter (Signed)
 Yesi,  This pt's BMI is greater than 50; their procedure will need to be performed at the hospital.  Thanks,  Cathlyn Parsons

## 2023-12-05 NOTE — Telephone Encounter (Signed)
 Informed pt that after anesthesia review based on current  BMI it has been determined it would be safer to be done at the hospital. Informed pt the Dr. Legrand and his RN;s have been notified for the need for hospital procedure date, that current procedure date will be canceled and that he will be hearing from Dr. Clayburn office once the new scheduled time has been made. Pt states he understands and has no questions at this time.

## 2023-12-05 NOTE — Telephone Encounter (Signed)
 Spoke with the patient. He is going to have a Cologuard. Will not need a colonoscopy.

## 2023-12-05 NOTE — Telephone Encounter (Signed)
 Called to inform pt that he is to have an OV prior to scheduling procedure since he has not been seen. Pt states that he was given the option to do cologuard or colonoscopy and he is going to call PCP and do it instead of colonoscopy because he is not sure if the colonoscopy would be a cost issue. Requested RN to cancel everything and that if he was instructed that he needs a colonoscopy that he will call back. Instructed pt that if he does need to reschedule to call and ask for OV. Pt states he will.

## 2023-12-10 ENCOUNTER — Encounter

## 2023-12-12 ENCOUNTER — Ambulatory Visit (INDEPENDENT_AMBULATORY_CARE_PROVIDER_SITE_OTHER): Admitting: Podiatry

## 2023-12-12 ENCOUNTER — Encounter: Payer: Self-pay | Admitting: Podiatry

## 2023-12-12 DIAGNOSIS — B351 Tinea unguium: Secondary | ICD-10-CM

## 2023-12-12 MED ORDER — TERBINAFINE HCL 250 MG PO TABS: 250.0000 mg | ORAL_TABLET | Freq: Every day | ORAL | 0 refills | Status: AC

## 2023-12-12 NOTE — Progress Notes (Signed)
  Subjective:  Patient ID: Manus Curet, male    DOB: [birth year],   MRN: [id number]  Chief Complaint  Patient presents with   Nail Problem    Lamisil  worked well new growth is clear. Nails are less thick. Not diabetic no anti coag    48 y.o. male presents for follow-up of fungal nails . He has finished the lamisil  and relates they are doing better. Still some changes distally but growing out.  . Denies any other pedal complaints. Denies n/v/f/c.   Past Medical History:  Diagnosis Date   Allergy    Headache(784.0)    Hypogonadism male    Morbid obesity (HCC)    OSA on CPAP    Psoriasis    Psoriasis    Sleep apnea    C PAP    Objective:  Physical Exam: Vascular: DP/PT pulses 2/4 bilateral. CFT <3 seconds. Normal hair growth on digits. No edema.  Skin. No lacerations or abrasions bilateral feet. Bilateral hallux nails are improved in thickness and appearance about 1/2 of the nail.  Musculoskeletal: MMT 5/5 bilateral lower extremities in DF, PF, Inversion and Eversion. Deceased ROM in DF of ankle joint.  Neurological: Sensation intact to light touch.   Assessment:   1. Onychomycosis       Plan:  Patient was evaluated and treated and all questions answered. -Examined patient -Discussed treatment options for painful dystrophic nails  -Nail on right debrided back to well attached nail without incident and to patient comfort.  -Culture positive for dermatophyte -Discussed fungal nail treatment options including oral, topical, and laser treatments.  -Nearly cleared in the nail will do one more month pulse starting Nov 2nd. One month supply sent to pharmacy.  -Patient to return as needed   Asberry Failing, DPM

## 2023-12-13 ENCOUNTER — Encounter: Payer: Self-pay | Admitting: Adult Health

## 2023-12-13 ENCOUNTER — Ambulatory Visit (INDEPENDENT_AMBULATORY_CARE_PROVIDER_SITE_OTHER): Admitting: Adult Health

## 2023-12-13 DIAGNOSIS — G4733 Obstructive sleep apnea (adult) (pediatric): Secondary | ICD-10-CM

## 2023-12-13 DIAGNOSIS — G4736 Sleep related hypoventilation in conditions classified elsewhere: Secondary | ICD-10-CM | POA: Diagnosis not present

## 2023-12-13 NOTE — Progress Notes (Signed)
 Virtual Visit via Video Note  I connected with Jeff Boyle on 12/13/23 at  1:30 PM EDT by a video enabled telemedicine application and verified that I am speaking with the correct person using two identifiers.  Location: Patient: Home  Provider: Office    I discussed the limitations of evaluation and management by telemedicine and the availability of in person appointments. The patient expressed understanding and agreed to proceed.  History of Present Illness: 48 year old male seen for sleep consult October 24, 2023 to evaluate for sleep apnea Previous history of sleep apnea diagnosed in 2009 was on CPAP for a few years but discontinued after significant weight loss after gastric bypass surgery in 2011  Today's video visit is to review recent home sleep study results.  Patient was having restless sleep and daytime sleepiness last visit.  Previously been diagnosed with sleep apnea in the past.  History of gastric bypass with 100 pound weight loss however recently as he regained some weight back current weight is at 406 pounds with a BMI 53.  Patient was set up for home sleep study that was completed 11/16/2023 that showed mild sleep apnea the AHI 12/hour and SpO2 low at 75%.  We discussed his sleep study results in detail.  We discussed treatment options including weight loss oral appliance and CPAP therapy.  Patient is currently using compounded semaglutide which is helping him in his weight loss journey.  Patient would like to start on CPAP therapy.  Has significant symptoms with restless sleep and daytime sleepiness.  Past Medical History:  Diagnosis Date   Allergy    Headache(784.0)    Hypogonadism male    Morbid obesity (HCC)    OSA on CPAP    Psoriasis    Psoriasis    Sleep apnea    C PAP   Current Outpatient Medications on File Prior to Visit  Medication Sig Dispense Refill   Cholecalciferol (VITAMIN D3) 1.25 MG (50000 UT) CAPS Take 1 capsule (1.25 mg total) by mouth every 7  (seven) days. 12 capsule 0   Semaglutide-Weight Management 0.25 MG/0.5ML SOAJ Inject 0.25 mg into the skin.     [START ON 01/12/2024] terbinafine  (LAMISIL ) 250 MG tablet Take 1 tablet (250 mg total) by mouth daily. 30 tablet 0   trimethoprim -polymyxin b  (POLYTRIM ) ophthalmic solution Place 1 drop into the left eye every 4 (four) hours. 10 mL 0   No current facility-administered medications on file prior to visit.       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  Went over treatment options.  Patient will continue to work on healthy weight loss.  Will begin CPAP therapy begin auto CPAP 5 to 15 cm H2O.  CPAP care discussed in detail.  Plan  Patient Instructions  Begin CPAP at bedtime, wear all night long for at least 6 hours or more Work on healthy weight Do not drive if sleepy Follow-up in 3 months and as needed    Follow Up Instructions:    I discussed the assessment and treatment plan with the patient. The patient was provided an opportunity to ask questions and all were answered. The patient agreed with the plan and demonstrated an understanding of the instructions.   The patient was advised to call back or seek an in-person evaluation if the symptoms worsen or if the condition fails to improve as anticipated.  I provided 21  minutes  of non-face-to-face time during this encounter.   Madelin Stank, NP

## 2023-12-13 NOTE — Patient Instructions (Signed)
 Begin CPAP at bedtime, wear all night long for at least 6 hours or more Work on healthy weight Do not drive if sleepy Follow-up in 3 months and as needed

## 2023-12-16 NOTE — Telephone Encounter (Signed)
 Copied from CRM [id number]. Topic: Appointments - Scheduling Inquiry for Clinic >> Dec 13, 2023  2:09 PM Whitney O wrote: Reason for CRM: patient says he has been trying to get into the appointment but its showing as a past appointment instead of upcoming the mychart support had patient on back line and transferred him to me for more assistance . .on the phone with Mr Almeda now to see iif  a nurse or someone is available to help patient or speak with him. No one responded mr blair said to send a message back clinical for someone to reach out to patient concerning his appointment . Patient say he spoke with a nurse or cma this morning to confirm the appointment and after that it was showing as a past appointment  [phone]  Pt had appt 10/3. Nfn

## 2023-12-24 ENCOUNTER — Encounter: Admitting: Gastroenterology

## 2024-01-05 ENCOUNTER — Other Ambulatory Visit: Payer: Self-pay | Admitting: Medical Genetics

## 2024-01-05 DIAGNOSIS — Z006 Encounter for examination for normal comparison and control in clinical research program: Secondary | ICD-10-CM

## 2024-01-06 ENCOUNTER — Other Ambulatory Visit: Payer: Self-pay | Admitting: Urology

## 2024-01-06 DIAGNOSIS — E230 Hypopituitarism: Secondary | ICD-10-CM

## 2024-01-09 ENCOUNTER — Telehealth: Payer: Self-pay | Admitting: Adult Health

## 2024-01-09 NOTE — Telephone Encounter (Signed)
 Left voicemail for patient to call back to schedule cpap compliance between 02/07/2024 and 04/06/2024.

## 2024-01-10 NOTE — Telephone Encounter (Signed)
 Patient scheduled.

## 2024-01-13 ENCOUNTER — Encounter: Payer: Self-pay | Admitting: Radiology

## 2024-03-16 ENCOUNTER — Encounter: Payer: Self-pay | Admitting: Adult Health

## 2024-03-16 ENCOUNTER — Telehealth (INDEPENDENT_AMBULATORY_CARE_PROVIDER_SITE_OTHER): Admitting: Adult Health

## 2024-03-16 DIAGNOSIS — G4733 Obstructive sleep apnea (adult) (pediatric): Secondary | ICD-10-CM

## 2024-03-16 NOTE — Progress Notes (Signed)
 Virtual Visit via Video Note  I connected with Jeff Boyle on 03/16/2024 at  2:00 PM EST by a video enabled telemedicine application and verified that I am speaking with the correct person using two identifiers.  Location: Patient: Home  Provider: Office    I discussed the limitations of evaluation and management by telemedicine and the availability of in person appointments. The patient expressed understanding and agreed to proceed.  History of Present Illness: 49 year old male seen for sleep consult August 2025 to evaluate for sleep apnea Diagnosed with sleep apnea in 2009 on CPAP for a few years but discontinued after significant weight loss with gastric bypass surgery in 2011  Today's video visit is a 33-month follow-up.  Patient was seen in August for a sleep consult.  Patient previously been diagnosed with sleep apnea.  Was on CPAP but discontinued after significant weight loss from gastric bypass surgery.  Patient was set up for a home sleep study that was completed on November 16, 2023.  This showed mild sleep apnea with AHI at 12/hour and SpO2 low at 75%.  Last visit we discussed treatment options and patient wanted to proceed with CPAP therapy.  Since last visit patient says he is doing well since starting on CPAP . Feels more rested and benefiting from CPAP.  CPAP download shows good compliance with daily average usage at 5 hours.  Patient is on auto CPAP 5 to 20 cm H2O.  Daily average pressure 8.3cmH2o. AHI 0.1/hour. Using a nasal mask. [employer] [facility].  Has been dealing with back pain which is interfering with his sleep.  Pinched nerve is getting better.  Working on weight loss, taking compounded Ozempic. Down 35lbs   Past Medical History:  Diagnosis Date   Allergy    Headache(784.0)    Hypogonadism male    Morbid obesity (HCC)    OSA on CPAP    Psoriasis    Psoriasis    Sleep apnea    C PAP       Observations/Objective: 03/16/2024 Appears well in nad   Assessment and  Plan: OSA-well controlled on CPAP with perceived benefit.  Patient education on CPAP /care.   Morbid Obesity-ongoing struggles with weight management  Encouraged on healthy weight loss .   Plan  Patient Instructions  Continue on CPAP at bedtime, wear all night long for at least 6 hours or more Keep up good work Work on [employer] Do not drive if sleepy Follow-up in 6 months with Dr. Olena or Jayden Kratochvil NP and as needed    Follow Up Instructions:    I discussed the assessment and treatment plan with the patient. The patient was provided an opportunity to ask questions and all were answered. The patient agreed with the plan and demonstrated an understanding of the instructions.   The patient was advised to call back or seek an in-person evaluation if the symptoms worsen or if the condition fails to improve as anticipated.  I provided 21 minutes of non-face-to-face time during this encounter.   Madelin Stank, NP

## 2024-03-16 NOTE — Patient Instructions (Signed)
 Continue on CPAP at bedtime, wear all night long for at least 6 hours or more Keep up good work Work on [employer] Do not drive if sleepy Follow-up in 6 months with Dr. Olena or Graesyn Schreifels NP and as needed
